# Patient Record
Sex: Female | Born: 1995 | Race: Black or African American | Hispanic: No | Marital: Single | Smoking: Former smoker
Health system: Southern US, Community
[De-identification: ages and names within clinical notes are randomized; demographics above are authoritative.]

## PROBLEM LIST (undated history)

## (undated) ENCOUNTER — Inpatient Hospital Stay: Payer: Self-pay

## (undated) DIAGNOSIS — M1990 Unspecified osteoarthritis, unspecified site: Secondary | ICD-10-CM

## (undated) DIAGNOSIS — J45909 Unspecified asthma, uncomplicated: Secondary | ICD-10-CM

## (undated) DIAGNOSIS — Z8619 Personal history of other infectious and parasitic diseases: Secondary | ICD-10-CM

## (undated) HISTORY — DX: Unspecified asthma, uncomplicated: J45.909

## (undated) HISTORY — DX: Personal history of other infectious and parasitic diseases: Z86.19

---
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
## 2015-02-11 LAB — OB RESULTS CONSOLE VARICELLA ZOSTER ANTIBODY, IGG: VARICELLA IGG: IMMUNE

## 2015-02-11 LAB — OB RESULTS CONSOLE RUBELLA ANTIBODY, IGM: Rubella: IMMUNE

## 2015-02-11 LAB — OB RESULTS CONSOLE GC/CHLAMYDIA
CHLAMYDIA, DNA PROBE: NEGATIVE
GC PROBE AMP, GENITAL: NEGATIVE

## 2015-02-11 LAB — OB RESULTS CONSOLE HEPATITIS B SURFACE ANTIGEN: HEP B S AG: NEGATIVE

## 2015-02-11 LAB — OB RESULTS CONSOLE HIV ANTIBODY (ROUTINE TESTING): HIV: NONREACTIVE

## 2015-02-11 LAB — OB RESULTS CONSOLE RPR: RPR: NONREACTIVE

## 2015-04-01 ENCOUNTER — Encounter: Payer: Self-pay | Admitting: Emergency Medicine

## 2015-04-01 ENCOUNTER — Emergency Department
Admission: EM | Admit: 2015-04-01 | Discharge: 2015-04-01 | Discharge status: Against Medical Advice | Payer: Medicaid Other | Attending: Emergency Medicine | Admitting: Emergency Medicine

## 2015-04-01 DIAGNOSIS — R1010 Upper abdominal pain, unspecified: Secondary | ICD-10-CM | POA: Insufficient documentation

## 2015-04-01 LAB — CBC WITH DIFFERENTIAL/PLATELET
BASOS PCT: 0 %
Basophils Absolute: 0 10*3/uL (ref 0–0.1)
Eosinophils Absolute: 0.1 10*3/uL (ref 0–0.7)
Eosinophils Relative: 0 %
HEMATOCRIT: 32.3 % — AB (ref 35.0–47.0)
HEMOGLOBIN: 10.8 g/dL — AB (ref 12.0–16.0)
LYMPHS PCT: 14 %
Lymphs Abs: 1.6 10*3/uL (ref 1.0–3.6)
MCH: 30.1 pg (ref 26.0–34.0)
MCHC: 33.3 g/dL (ref 32.0–36.0)
MCV: 90.3 fL (ref 80.0–100.0)
MONO ABS: 0.5 10*3/uL (ref 0.2–0.9)
MONOS PCT: 4 %
Neutro Abs: 9.2 10*3/uL — ABNORMAL HIGH (ref 1.4–6.5)
Neutrophils Relative %: 82 %
Platelets: 364 10*3/uL (ref 150–440)
RBC: 3.58 MIL/uL — ABNORMAL LOW (ref 3.80–5.20)
RDW: 14.2 % (ref 11.5–14.5)
WBC: 11.4 10*3/uL — ABNORMAL HIGH (ref 3.6–11.0)

## 2015-04-01 LAB — COMPREHENSIVE METABOLIC PANEL
ALK PHOS: 64 U/L (ref 38–126)
ALT: 31 U/L (ref 14–54)
AST: 22 U/L (ref 15–41)
Albumin: 3.5 g/dL (ref 3.5–5.0)
Anion gap: 7 (ref 5–15)
BUN: 9 mg/dL (ref 6–20)
CALCIUM: 9.2 mg/dL (ref 8.9–10.3)
CO2: 23 mmol/L (ref 22–32)
Chloride: 106 mmol/L (ref 101–111)
Creatinine, Ser: 0.63 mg/dL (ref 0.44–1.00)
GFR calc non Af Amer: >60 mL/min (ref >60)
GLUCOSE: 94 mg/dL (ref 65–99)
Potassium: 3.6 mmol/L (ref 3.5–5.1)
Sodium: 136 mmol/L (ref 135–145)
TOTAL PROTEIN: 7 g/dL (ref 6.5–8.1)

## 2015-04-01 LAB — LIPASE, BLOOD: Lipase: 20 U/L — ABNORMAL LOW (ref 22–51)

## 2015-04-01 NOTE — ED Notes (Signed)
Pt not answering in lobby

## 2015-04-01 NOTE — ED Notes (Signed)
Pt not found in lobby 

## 2015-04-01 NOTE — ED Notes (Signed)
Pt states that she was awakened from sleep with bilat upper abd pain radiating through to her back on each side, pt states that she has vomited a lot since having the pain and states that the pain comes and goes. Pt states that she cont to have pain but not as bad as it was, pt reports being approx 4 mos pregnant

## 2015-04-04 ENCOUNTER — Telehealth: Payer: Self-pay | Admitting: Emergency Medicine

## 2015-04-04 NOTE — ED Notes (Signed)
Called patient due to lwot to inquire about condition and follow up plans. Numbers for home and mobile do not take calls or are not in service.

## 2015-06-21 ENCOUNTER — Encounter: Payer: Self-pay | Admitting: *Deleted

## 2015-06-21 ENCOUNTER — Observation Stay
Admission: EM | Admit: 2015-06-21 | Discharge: 2015-06-21 | Disposition: A | Discharge status: Home / Self Care | Payer: Medicaid Other | Attending: Certified Nurse Midwife | Admitting: Certified Nurse Midwife

## 2015-06-21 DIAGNOSIS — Z3A27 27 weeks gestation of pregnancy: Secondary | ICD-10-CM | POA: Diagnosis not present

## 2015-06-21 DIAGNOSIS — O368120 Decreased fetal movements, second trimester, not applicable or unspecified: Principal | ICD-10-CM | POA: Insufficient documentation

## 2015-06-21 DIAGNOSIS — O368190 Decreased fetal movements, unspecified trimester, not applicable or unspecified: Secondary | ICD-10-CM | POA: Diagnosis present

## 2015-06-21 DIAGNOSIS — O0990 Supervision of high risk pregnancy, unspecified, unspecified trimester: Secondary | ICD-10-CM

## 2015-06-21 NOTE — Progress Notes (Signed)
L&D triage Note  19 year old G1 P0 with EDC=09/16/2015 by a 9 week ultrasound presents at 27 weeks 4 days with decreased FM today.  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 Has been eating well today. No vaginal bleeding or LOF [facility] also remarkable for asthma and BMI >40 Prenatal genetic testing has been negative  Exam: General; alert, awake, in NAD BP 116/51 mmHg  Pulse 106  Temp(Src) 98.4 F (36.9 C) (Oral)  Resp 18  LMP  (Approximate)  FHR 140s with accelerations to 160s, moderate variability Toco: no contractions  A: IUP at 27wks4d with reactive NST  P: DC home  FU next week for ROB and 1 HOUR gtt.  Farrel ConnersGUTIERREZ, Vivianne Carles, CNM

## 2015-07-28 NOTE — L&D Delivery Note (Signed)
VAGINAL DELIVERY NOTE:  Date of Delivery: 09/15/2015 Primary OB: [facility]  Gestational Age/EDD: [redacted]w[redacted]d 09/16/2015, by Other Basis Antepartum complications: Obesity, Asthma Attending Physician: Annamarie Major, MD, FACOG Delivery Type: spontaneous vaginal delivery  Anesthesia: epidural Laceration: 1st degree Episiotomy: none Placenta: spontaneous Intrapartum complications: None Estimated Blood Loss: GBS: None Procedure Details: NSVD, placenta easily delivers, repair of small first degree lac.  Baby: Liveborn female, Apgars 8/9

## 2015-08-22 ENCOUNTER — Observation Stay
Admission: EM | Admit: 2015-08-22 | Discharge: 2015-08-22 | Disposition: A | Discharge status: Home / Self Care | Payer: Medicaid Other | Attending: Obstetrics & Gynecology | Admitting: Obstetrics & Gynecology

## 2015-08-22 DIAGNOSIS — Z3A39 39 weeks gestation of pregnancy: Secondary | ICD-10-CM | POA: Diagnosis not present

## 2015-08-22 DIAGNOSIS — O479 False labor, unspecified: Secondary | ICD-10-CM | POA: Diagnosis present

## 2015-08-22 LAB — PROTEIN / CREATININE RATIO, URINE
Creatinine, Urine: 51 mg/dL
PROTEIN CREATININE RATIO: 0.18 mg/mg{creat} — AB (ref 0.00–0.15)
Total Protein, Urine: 9 mg/dL

## 2015-08-22 LAB — COMPREHENSIVE METABOLIC PANEL
ALBUMIN: 3 g/dL — AB (ref 3.5–5.0)
ALK PHOS: 205 U/L — AB (ref 38–126)
ALT: 10 U/L — AB (ref 14–54)
AST: 14 U/L — AB (ref 15–41)
Anion gap: 8 (ref 5–15)
BILIRUBIN TOTAL: 0.4 mg/dL (ref 0.3–1.2)
BUN: 7 mg/dL (ref 6–20)
CALCIUM: 9 mg/dL (ref 8.9–10.3)
CO2: 21 mmol/L — ABNORMAL LOW (ref 22–32)
Chloride: 107 mmol/L (ref 101–111)
Creatinine, Ser: 0.51 mg/dL (ref 0.44–1.00)
GFR calc Af Amer: >60 mL/min (ref >60)
GFR calc non Af Amer: >60 mL/min (ref >60)
GLUCOSE: 81 mg/dL (ref 65–99)
Potassium: 3.9 mmol/L (ref 3.5–5.1)
Sodium: 136 mmol/L (ref 135–145)
TOTAL PROTEIN: 6.8 g/dL (ref 6.5–8.1)

## 2015-08-22 LAB — CBC
HEMATOCRIT: 31.3 % — AB (ref 35.0–47.0)
HEMOGLOBIN: 10.1 g/dL — AB (ref 12.0–16.0)
MCH: 26.7 pg (ref 26.0–34.0)
MCHC: 32.2 g/dL (ref 32.0–36.0)
MCV: 82.7 fL (ref 80.0–100.0)
Platelets: 397 10*3/uL (ref 150–440)
RBC: 3.79 MIL/uL — ABNORMAL LOW (ref 3.80–5.20)
RDW: 16.7 % — ABNORMAL HIGH (ref 11.5–14.5)
WBC: 8.3 10*3/uL (ref 3.6–11.0)

## 2015-08-22 LAB — URINALYSIS COMPLETE WITH MICROSCOPIC (ARMC ONLY)
BILIRUBIN URINE: NEGATIVE
Bacteria, UA: NONE SEEN
Glucose, UA: NEGATIVE mg/dL
Hgb urine dipstick: NEGATIVE
KETONES UR: NEGATIVE mg/dL
Leukocytes, UA: NEGATIVE
Nitrite: NEGATIVE
PROTEIN: NEGATIVE mg/dL
Specific Gravity, Urine: 1.005 (ref 1.005–1.030)
pH: 6 (ref 5.0–8.0)

## 2015-08-22 LAB — OB RESULTS CONSOLE GBS: GBS: NEGATIVE

## 2015-08-22 MED ORDER — ACETAMINOPHEN 325 MG PO TABS: 650.0000 mg | ORAL_TABLET | ORAL | Status: DC | PRN

## 2015-08-22 MED ORDER — ONDANSETRON HCL 4 MG/2ML IJ SOLN: 4.0000 mg | Freq: Four times a day (QID) | INTRAMUSCULAR | Status: DC | PRN

## 2015-08-22 NOTE — Discharge Instructions (Signed)
Discharge instructions reviewed with patient, pt. Verbalized understanding of all discharge instructions.  Pt. To follow-up at the office on Monday, January 30th.

## 2015-08-22 NOTE — Plan of Care (Signed)
G1 P0 EDC 09/16/15 36.3 week pt arrived to [facility] from [facility] per Dr. Vergie Living for b/p evaluation. Will notify MD of pt arrival and request orders. Pt placed on EFM and seriel b/p begun at this time. Ellison Carwin RNC

## 2015-08-26 LAB — OB RESULTS CONSOLE RPR: RPR: NONREACTIVE

## 2015-08-26 LAB — OB RESULTS CONSOLE HIV ANTIBODY (ROUTINE TESTING): HIV: NONREACTIVE

## 2015-09-14 ENCOUNTER — Inpatient Hospital Stay
Admission: EM | Admit: 2015-09-14 | Discharge: 2015-09-17 | DRG: 775 | Disposition: A | Discharge status: Home / Self Care | Payer: Medicaid Other | Attending: Obstetrics & Gynecology | Admitting: Obstetrics & Gynecology

## 2015-09-14 DIAGNOSIS — J45909 Unspecified asthma, uncomplicated: Secondary | ICD-10-CM | POA: Diagnosis present

## 2015-09-14 DIAGNOSIS — O9952 Diseases of the respiratory system complicating childbirth: Secondary | ICD-10-CM | POA: Diagnosis present

## 2015-09-14 DIAGNOSIS — O479 False labor, unspecified: Secondary | ICD-10-CM | POA: Diagnosis present

## 2015-09-14 DIAGNOSIS — O4290 Premature rupture of membranes, unspecified as to length of time between rupture and onset of labor, unspecified weeks of gestation: Secondary | ICD-10-CM | POA: Diagnosis present

## 2015-09-14 DIAGNOSIS — Z3A39 39 weeks gestation of pregnancy: Secondary | ICD-10-CM

## 2015-09-14 DIAGNOSIS — O0990 Supervision of high risk pregnancy, unspecified, unspecified trimester: Secondary | ICD-10-CM

## 2015-09-14 DIAGNOSIS — O99214 Obesity complicating childbirth: Principal | ICD-10-CM | POA: Diagnosis present

## 2015-09-14 NOTE — OB Triage Note (Signed)
Pt presents to L&D with c/o gush of fluid at 2240. Continues to leak upon arrival. Denies contractions or bleeding, reports good fetal movements. EFM applied and explained plan to monitor fetal and maternal well being and assess complaints. Upon initial assessment BP elevated, pt reports  Being evaluated in L&D 2 weeks ago for blood pressure but "everything was ok".

## 2015-09-15 ENCOUNTER — Inpatient Hospital Stay: Payer: Medicaid Other | Admitting: Anesthesiology

## 2015-09-15 DIAGNOSIS — O99214 Obesity complicating childbirth: Secondary | ICD-10-CM | POA: Diagnosis present

## 2015-09-15 DIAGNOSIS — O4290 Premature rupture of membranes, unspecified as to length of time between rupture and onset of labor, unspecified weeks of gestation: Secondary | ICD-10-CM | POA: Diagnosis present

## 2015-09-15 DIAGNOSIS — J45909 Unspecified asthma, uncomplicated: Secondary | ICD-10-CM | POA: Diagnosis present

## 2015-09-15 DIAGNOSIS — O9952 Diseases of the respiratory system complicating childbirth: Secondary | ICD-10-CM | POA: Diagnosis present

## 2015-09-15 DIAGNOSIS — Z3A39 39 weeks gestation of pregnancy: Secondary | ICD-10-CM | POA: Diagnosis not present

## 2015-09-15 LAB — CBC
HEMATOCRIT: 32.3 % — AB (ref 35.0–47.0)
HEMOGLOBIN: 10.5 g/dL — AB (ref 12.0–16.0)
MCH: 27.6 pg (ref 26.0–34.0)
MCHC: 32.4 g/dL (ref 32.0–36.0)
MCV: 85.1 fL (ref 80.0–100.0)
Platelets: 357 10*3/uL (ref 150–440)
RBC: 3.79 MIL/uL — ABNORMAL LOW (ref 3.80–5.20)
RDW: 18.9 % — ABNORMAL HIGH (ref 11.5–14.5)
WBC: 8.2 10*3/uL (ref 3.6–11.0)

## 2015-09-15 LAB — COMPREHENSIVE METABOLIC PANEL
ALK PHOS: 229 U/L — AB (ref 38–126)
ALT: 11 U/L — ABNORMAL LOW (ref 14–54)
ANION GAP: 7 (ref 5–15)
AST: 20 U/L (ref 15–41)
Albumin: 2.7 g/dL — ABNORMAL LOW (ref 3.5–5.0)
BILIRUBIN TOTAL: 0.4 mg/dL (ref 0.3–1.2)
BUN: 9 mg/dL (ref 6–20)
CALCIUM: 9.2 mg/dL (ref 8.9–10.3)
CO2: 22 mmol/L (ref 22–32)
CREATININE: 0.63 mg/dL (ref 0.44–1.00)
Chloride: 109 mmol/L (ref 101–111)
GFR calc non Af Amer: >60 mL/min (ref >60)
Glucose, Bld: 104 mg/dL — ABNORMAL HIGH (ref 65–99)
Potassium: 4.2 mmol/L (ref 3.5–5.1)
Sodium: 138 mmol/L (ref 135–145)
TOTAL PROTEIN: 6.4 g/dL — AB (ref 6.5–8.1)

## 2015-09-15 LAB — ABO/RH: ABO/RH(D): B POS

## 2015-09-15 LAB — OB RESULTS CONSOLE ABO/RH: RH Type: POSITIVE

## 2015-09-15 LAB — TYPE AND SCREEN
ABO/RH(D): B POS
ANTIBODY SCREEN: NEGATIVE

## 2015-09-15 LAB — CHLAMYDIA/NGC RT PCR (ARMC ONLY)
Chlamydia Tr: NOT DETECTED
N gonorrhoeae: NOT DETECTED

## 2015-09-15 LAB — PROTEIN / CREATININE RATIO, URINE
Creatinine, Urine: 107 mg/dL
Protein Creatinine Ratio: 0.79 mg/mg{Cre} — ABNORMAL HIGH (ref 0.00–0.15)
Total Protein, Urine: 84 mg/dL

## 2015-09-15 MED ORDER — OXYTOCIN BOLUS FROM INFUSION
500.0000 mL | INTRAVENOUS | Status: DC
  Administered 2015-09-15: 500 mL via INTRAVENOUS

## 2015-09-15 MED ORDER — SIMETHICONE 80 MG PO CHEW: 80.0000 mg | CHEWABLE_TABLET | ORAL | Status: DC | PRN

## 2015-09-15 MED ORDER — LACTATED RINGERS IV SOLN: 500.0000 mL | INTRAVENOUS | Status: DC | PRN

## 2015-09-15 MED ORDER — SENNOSIDES-DOCUSATE SODIUM 8.6-50 MG PO TABS
2.0000 | ORAL_TABLET | ORAL | Status: DC
  Administered 2015-09-15 – 2015-09-16 (×2): 2 via ORAL
  Filled 2015-09-15 (×2): qty 2

## 2015-09-15 MED ORDER — FENTANYL 2.5 MCG/ML W/ROPIVACAINE 0.2% IN NS 100 ML EPIDURAL INFUSION (ARMC-ANES)
EPIDURAL | Status: AC
  Administered 2015-09-15: 9 mL/h via EPIDURAL
  Filled 2015-09-15: qty 100

## 2015-09-15 MED ORDER — LIDOCAINE HCL (PF) 1 % IJ SOLN
INTRAMUSCULAR | Status: AC
  Filled 2015-09-15: qty 30

## 2015-09-15 MED ORDER — OXYCODONE-ACETAMINOPHEN 5-325 MG PO TABS: 1.0000 | ORAL_TABLET | ORAL | Status: DC | PRN

## 2015-09-15 MED ORDER — ZOLPIDEM TARTRATE 5 MG PO TABS: 5.0000 mg | ORAL_TABLET | Freq: Every evening | ORAL | Status: DC | PRN

## 2015-09-15 MED ORDER — MISOPROSTOL 200 MCG PO TABS
ORAL_TABLET | ORAL | Status: AC
  Filled 2015-09-15: qty 4

## 2015-09-15 MED ORDER — IBUPROFEN 600 MG PO TABS
600.0000 mg | ORAL_TABLET | Freq: Four times a day (QID) | ORAL | Status: DC
  Administered 2015-09-15 – 2015-09-17 (×7): 600 mg via ORAL
  Filled 2015-09-15 (×7): qty 1

## 2015-09-15 MED ORDER — AMMONIA AROMATIC IN INHA
RESPIRATORY_TRACT | Status: AC
  Filled 2015-09-15: qty 10

## 2015-09-15 MED ORDER — PHENYLEPHRINE 40 MCG/ML (10ML) SYRINGE FOR IV PUSH (FOR BLOOD PRESSURE SUPPORT)
80.0000 ug | PREFILLED_SYRINGE | INTRAVENOUS | Status: DC | PRN
  Filled 2015-09-15: qty 2

## 2015-09-15 MED ORDER — ONDANSETRON HCL 4 MG/2ML IJ SOLN: 4.0000 mg | Freq: Four times a day (QID) | INTRAMUSCULAR | Status: DC | PRN

## 2015-09-15 MED ORDER — SODIUM CHLORIDE 0.9% FLUSH: 3.0000 mL | Freq: Two times a day (BID) | INTRAVENOUS | Status: DC

## 2015-09-15 MED ORDER — LACTATED RINGERS IV SOLN
INTRAVENOUS | Status: DC
Start: 2015-09-15 — End: 2015-09-15
  Administered 2015-09-15: via INTRAVENOUS

## 2015-09-15 MED ORDER — BUPIVACAINE HCL (PF) 0.25 % IJ SOLN
INTRAMUSCULAR | Status: DC | PRN
  Administered 2015-09-15: 5 mL via EPIDURAL

## 2015-09-15 MED ORDER — LACTATED RINGERS IV SOLN: 500.0000 mL | Freq: Once | INTRAVENOUS | Status: DC

## 2015-09-15 MED ORDER — OXYTOCIN 40 UNITS IN LACTATED RINGERS INFUSION - SIMPLE MED
2.5000 [IU]/h | INTRAVENOUS | Status: DC
  Administered 2015-09-15: 2.5 [IU]/h via INTRAVENOUS
  Filled 2015-09-15: qty 1000

## 2015-09-15 MED ORDER — LIDOCAINE HCL (PF) 1 % IJ SOLN
INTRAMUSCULAR | Status: DC | PRN
  Administered 2015-09-15: 3 mL via SUBCUTANEOUS

## 2015-09-15 MED ORDER — ONDANSETRON HCL 4 MG/2ML IJ SOLN: 4.0000 mg | INTRAMUSCULAR | Status: DC | PRN

## 2015-09-15 MED ORDER — OXYCODONE-ACETAMINOPHEN 5-325 MG PO TABS: 2.0000 | ORAL_TABLET | ORAL | Status: DC | PRN

## 2015-09-15 MED ORDER — EPHEDRINE 5 MG/ML INJ
10.0000 mg | INTRAVENOUS | Status: DC | PRN
  Filled 2015-09-15: qty 2

## 2015-09-15 MED ORDER — FENTANYL 2.5 MCG/ML W/ROPIVACAINE 0.2% IN NS 100 ML EPIDURAL INFUSION (ARMC-ANES): 9.0000 mL/h | EPIDURAL | Status: DC

## 2015-09-15 MED ORDER — SODIUM CHLORIDE 0.9% FLUSH: 3.0000 mL | INTRAVENOUS | Status: DC | PRN

## 2015-09-15 MED ORDER — BUTORPHANOL TARTRATE 1 MG/ML IJ SOLN
1.0000 mg | INTRAMUSCULAR | Status: DC | PRN
  Administered 2015-09-15 (×3): 1 mg via INTRAVENOUS
  Filled 2015-09-15 (×3): qty 1

## 2015-09-15 MED ORDER — BENZOCAINE-MENTHOL 20-0.5 % EX AERO: 1.0000 "application " | INHALATION_SPRAY | CUTANEOUS | Status: DC | PRN

## 2015-09-15 MED ORDER — DIPHENHYDRAMINE HCL 25 MG PO CAPS: 25.0000 mg | ORAL_CAPSULE | Freq: Four times a day (QID) | ORAL | Status: DC | PRN

## 2015-09-15 MED ORDER — ACETAMINOPHEN 325 MG PO TABS: 650.0000 mg | ORAL_TABLET | ORAL | Status: DC | PRN

## 2015-09-15 MED ORDER — SODIUM CHLORIDE 0.9 % IV SOLN: 250.0000 mL | INTRAVENOUS | Status: DC | PRN

## 2015-09-15 MED ORDER — LANOLIN HYDROUS EX OINT: TOPICAL_OINTMENT | CUTANEOUS | Status: DC | PRN

## 2015-09-15 MED ORDER — OXYTOCIN 10 UNIT/ML IJ SOLN
INTRAMUSCULAR | Status: AC
  Filled 2015-09-15: qty 2

## 2015-09-15 MED ORDER — WITCH HAZEL-GLYCERIN EX PADS: 1.0000 "application " | MEDICATED_PAD | CUTANEOUS | Status: DC | PRN

## 2015-09-15 MED ORDER — TERBUTALINE SULFATE 1 MG/ML IJ SOLN: 0.2500 mg | Freq: Once | INTRAMUSCULAR | Status: DC | PRN

## 2015-09-15 MED ORDER — LIDOCAINE-EPINEPHRINE (PF) 1.5 %-1:200000 IJ SOLN
INTRAMUSCULAR | Status: DC | PRN
  Administered 2015-09-15: 4 mL via EPIDURAL

## 2015-09-15 MED ORDER — DIPHENHYDRAMINE HCL 50 MG/ML IJ SOLN: 12.5000 mg | INTRAMUSCULAR | Status: DC | PRN

## 2015-09-15 MED ORDER — OXYTOCIN 40 UNITS IN LACTATED RINGERS INFUSION - SIMPLE MED
1.0000 m[IU]/min | INTRAVENOUS | Status: DC
  Administered 2015-09-15: 1 m[IU]/min via INTRAVENOUS

## 2015-09-15 MED ORDER — DIBUCAINE 1 % RE OINT: 1.0000 "application " | TOPICAL_OINTMENT | RECTAL | Status: DC | PRN

## 2015-09-15 MED ORDER — ONDANSETRON HCL 4 MG PO TABS: 4.0000 mg | ORAL_TABLET | ORAL | Status: DC | PRN

## 2015-09-15 NOTE — Anesthesia Procedure Notes (Addendum)
Epidural Patient location during procedure: OB Start time: 09/15/2015 4:51 AM  Staffing Performed by: anesthesiologist   Preanesthetic Checklist Completed: patient identified, site marked, surgical consent, pre-op evaluation, timeout performed, IV checked, risks and benefits discussed and monitors and equipment checked  Epidural Patient position: sitting Prep: Betadine Patient monitoring: heart rate, continuous pulse ox and blood pressure Approach: midline Location: L4-L5 Injection technique: LOR saline  Needle:  Needle type: Tuohy  Needle gauge: 18 G Needle length: 9 cm and 9 Needle insertion depth: 9 cm Catheter type: closed end flexible Catheter size: 20 Guage Catheter at skin depth: 15 cm Test dose: negative and 1.5% lidocaine with Epi 1:200 K  Assessment Sensory level: T10 Events: blood not aspirated, injection not painful, no injection resistance, negative IV test and no paresthesia  Additional Notes   Patient tolerated the insertion well without complications.-SATD -IVTD. No paresthesia. Refer to [facility] nursing for VS and dosing. Easy pass of catheter and well tolerated.  Reason for block:procedure for pain

## 2015-09-15 NOTE — H&P (Signed)
Obstetrics Admission History & Physical   SROM 2240.   HPI:  20 y.o. G1P0 @ [redacted]w[redacted]d (09/16/2015, by [place]). Admitted on 09/14/2015:   Patient Active Problem List   Diagnosis Date Noted  . spontaneous rupture of membranes 09/15/2015  . Irregular contractions 08/22/2015  . Pregnancy 06/21/2015  . Obesity affecting pregnancy in second trimester 06/21/2015  . Labor     Presents for SROM at 2240, mild ctxs, no VB.Marland Kitchen  Prenatal care at: at [facility]     Pregnancy complicated by obesity, asthma  PMHx: History reviewed. No pertinent past medical history. PSHx: History reviewed. No pertinent past surgical history. Medications:  Prescriptions prior to admission  Medication Sig Dispense Refill Last Dose  . Prenatal Vit-Fe Fumarate-FA (MULTIVITAMIN-PRENATAL) 27-0.8 MG TABS tablet Take 1 tablet by mouth daily at 12 noon.   Unknown at Unknown time   Allergies: has No Known Allergies. OBHx:  OB History  Gravida Para Term Preterm AB SAB TAB Ectopic Multiple Living  1             # Outcome Date GA Lbr Len/2nd Weight Sex Delivery Anes PTL Lv  1 Current              ONG:EXBMWUXL/KGMWNUUVOZDG except as detailed in HPI. Soc Hx: Pregnancy welcomed  Objective:   Filed Vitals:   09/14/15 2322 09/14/15 2328  BP: 160/103 157/96  Pulse: 90 99  Temp: 98.9 F (37.2 C)   Resp: 20    General: Well nourished, well developed female in no acute distress.  Skin: Warm and dry.  Cardiovascular:Regular rate and rhythm. Respiratory: Clear to auscultation bilateral. Normal respiratory effort Abdomen: obese, gravid, min T Neuro/Psych: Normal mood and affect.   Pelvic exam: is limited by body habitus EGBUS: within normal limits Vagina: within normal limits  Cervix: 1 cm Uterus: Spontaneous uterine activity  Adnexa: not evaluated  EFM:FHR: 100-120 bpm, variability: moderate,  accelerations:  Present,  decelerations:  Absent Toco: intermittant   Perinatal info:  Blood type: B positive Rubella-  Immune Varicella -Immune TDaP Given during third trimester of this pregnancy RPR NR / HIV Neg/ HBsAg Neg   Assessment & Plan:   20 y.o. G1P0 @ [redacted]w[redacted]d, Admitted on 09/14/2015: Labor with SROM at home     Admit for labor and Fetal Wellbeing Reassuring  Monitor for spontaneous ctxs, add Pitocin if necessary Analgesia as needed Labs to monitor for PIH/ preclampsia

## 2015-09-15 NOTE — Anesthesia Preprocedure Evaluation (Signed)
Anesthesia Evaluation  Patient identified by MRN, date of birth, ID band Patient awake    Reviewed: Allergy & Precautions, H&P , NPO status , Patient's Chart, lab work & pertinent test results, reviewed documented beta blocker date and time   Airway Mallampati: III  TM Distance: >3 FB Neck ROM: full    Dental no notable dental hx. (+) Teeth Intact   Pulmonary neg pulmonary ROS,    Pulmonary exam normal breath sounds clear to auscultation       Cardiovascular Exercise Tolerance: Good negative cardio ROS Normal cardiovascular exam Rhythm:regular Rate:Normal     Neuro/Psych negative neurological ROS  negative psych ROS   GI/Hepatic negative GI ROS, Neg liver ROS,   Endo/Other  negative endocrine ROSMorbid obesity  Renal/GU negative Renal ROS  negative genitourinary   Musculoskeletal   Abdominal   Peds  Hematology negative hematology ROS (+)   Anesthesia Other Findings   Reproductive/Obstetrics (+) Pregnancy                             Anesthesia Physical Anesthesia Plan  ASA: III  Anesthesia Plan: Regional and Epidural   Post-op Pain Management:    Induction:   Airway Management Planned:   Additional Equipment:   Intra-op Plan:   Post-operative Plan:   Informed Consent: I have reviewed the patients History and Physical, chart, labs and discussed the procedure including the risks, benefits and alternatives for the proposed anesthesia with the patient or authorized representative who has indicated his/her understanding and acceptance.     Plan Discussed with: CRNA  Anesthesia Plan Comments:         Anesthesia Quick Evaluation

## 2015-09-15 NOTE — Discharge Instructions (Signed)

## 2015-09-15 NOTE — Plan of Care (Signed)
Requesting pain medication

## 2015-09-15 NOTE — Anesthesia Postprocedure Evaluation (Signed)
Anesthesia Post Note  Patient: Kerry Hester  Procedure(s) Performed: * No procedures listed *  Patient location during evaluation: Mother Baby Anesthesia Type: Epidural Level of consciousness: awake and awake and alert Pain management: pain level controlled Vital Signs Assessment: post-procedure vital signs reviewed and stable Respiratory status: nonlabored ventilation and spontaneous breathing Cardiovascular status: blood pressure returned to baseline Postop Assessment: no headache, no backache, epidural receding, no signs of nausea or vomiting, adequate PO intake and patient able to bend at knees Anesthetic complications: no    Last Vitals:  Filed Vitals:   09/15/15 1318 09/15/15 1342  BP: 100/49 147/71  Pulse: 90 89  Temp:  36.8 C  Resp: 16 18    Last Pain:  Filed Vitals:   09/15/15 1400  PainSc: 4                  Basilio Cairo

## 2015-09-15 NOTE — Discharge Summary (Signed)
Obstetrical Discharge Summary  Date of Admission: 09/14/2015 Date of Discharge: 09/17/15 Discharge Diagnosis: Term Pregnancy-delivered Primary OB:  [facility]   Gestational Age at Delivery: [redacted]w[redacted]d  Antepartum complications: Obesity, Asthma Date of Delivery: 09/15/15  Delivered By: Tiburcio Pea Delivery Type: spontaneous vaginal delivery Intrapartum complications/course: None Anesthesia: epidural Placenta: spontaneous Laceration: 1st degree Episiotomy: none Live born Female Birth Weight:  3800 APGAR: 8, 9   Post partum course: Since the delivery, patient has tolerate activity, diet, and daily functions without difficulty or complication.  Min lochia.  No breast concerns at this time.  No signs of depression currently.   Postpartum Exam:General appearance: alert, cooperative and appears stated age  Disposition: home with infant Rh Immune globulin given: not applicable Rubella vaccine given: no Varicella vaccine given: no Tdap vaccine given in AP or PP setting: given during prenatal care Flu vaccine given in AP or PP setting: given during prenatal care Contraception: Depo-Provera, Nexplanon, to be determined at post partum visit  Prenatal Labs: B POS//Rubella Immune//RPR negative//HIV negative/HepB Surface Ag negative//plans to bottle feed  Plan:  Kerry Hester was discharged to home in good condition. Follow-up appointment with [facility] provider in 6 weeks  Discharge Medications:   Medication List    TAKE these medications        multivitamin-prenatal 27-0.8 MG Tabs tablet  Take 1 tablet by mouth daily at 12 noon.        Follow-up arrangements:  With Dr Tiburcio Pea at 6 weeks postpartum for follow up   [facility], CNM   This patient and plan were discussed with Dr Vergie Living 09/17/2015

## 2015-09-16 LAB — CBC
HEMATOCRIT: 29.6 % — AB (ref 35.0–47.0)
HEMOGLOBIN: 9.5 g/dL — AB (ref 12.0–16.0)
MCH: 27 pg (ref 26.0–34.0)
MCHC: 32.1 g/dL (ref 32.0–36.0)
MCV: 84.1 fL (ref 80.0–100.0)
Platelets: 286 10*3/uL (ref 150–440)
RBC: 3.51 MIL/uL — ABNORMAL LOW (ref 3.80–5.20)
RDW: 19.2 % — ABNORMAL HIGH (ref 11.5–14.5)
WBC: 13.2 10*3/uL — ABNORMAL HIGH (ref 3.6–11.0)

## 2015-09-16 LAB — RPR: RPR: NONREACTIVE

## 2015-09-16 NOTE — Progress Notes (Signed)
Post Partum Day 1 Subjective: no complaints, voiding and bleeding is slowing  Objective: Blood pressure 113/65, pulse 78, temperature 98.5 F (36.9 C), temperature source Oral, resp. rate 18, height 5' (1.524 m), weight 118.389 kg (261 lb), SpO2 99 %, unknown if currently breastfeeding.  Physical Exam:  General: alert, cooperative and no distress. Is pumping and syringe feeding currently, but will be transitioning to bottle feeding Lochia: appropriate Uterine Fundus: firm but at U+2/ML/NT DVT Evaluation: No evidence of DVT seen on physical exam.   Recent Labs  09/15/15 0008 09/16/15 0636  HGB 10.5* 9.5*  HCT 32.3* 29.6*  WBC 8.2 13.2*  PLT 357 286    Assessment/Plan: Plan for discharge tomorrow  Watch fundus/ bleeding closely B POS/RI/ VI/ TDAP UTD      LOS: 1 day   Kerry Hester 09/16/2015, 11:29 AM

## 2015-09-16 NOTE — Lactation Note (Signed)
This note was copied from a baby's chart. Lactation Consultation Note Mom has large breasts & large nipple/areola.  Rylyn unable to achieve deep latch.  She sucks in her top lip and keeps trying to suck on tongue.  Mom has opted to pump and syringe feed pumped colostrum d/t nipples being too sore to latch and being unable to latch her on her own.  Pre pumped and got 5 ml.  Had to increase flange size to #30 and may need to go to #36 on right side d/t such large nipples/areola especially on right.  Convinced mom to try again on right breast with #24 nipple shield which is least sore side.  Filled nipple shield with 2 ml from the 5 ml that mom expressed.  Rylyn latched with minimal difficulty allowing mom to try on her own after filling NS with colostrum.  Mom denies pain when using nipple shield.  Nipple shield helps to keep tongue down and top lip flanged out for better suck.  Faxed information and spoke with [facility] about mom getting pump on discharge if still having difficulty.  There are no pumps available at [facility], but they put mom on waiting list (mom is second on list).  Demonstrated once again how to hand express colostrum.    Patient Name: Kerry Hester [id number] Date: 09/16/2015 Reason for consult: Follow-up assessment   Maternal Data Formula Feeding for Exclusion: No Has patient been taught Hand Expression?: Yes Does the patient have breastfeeding experience prior to this delivery?: No  Feeding Feeding Type: Breast Fed Length of feed: 25 min  LATCH Score/Interventions Latch: Repeated attempts needed to sustain latch, nipple held in mouth throughout feeding, stimulation needed to elicit sucking reflex. Intervention(s): Adjust position;Assist with latch;Breast massage;Breast compression  Audible Swallowing: A few with stimulation Intervention(s): Hand expression;Alternate breast massage  Type of Nipple: Everted at rest and after stimulation (Large breasts & nipple/areola, esp. on  right)  Comfort (Breast/Nipple): Filling, red/small blisters or bruises, mild/mod discomfort Problem noted:  (Mom c/o nipples too sore to latch - tried NS which helped)  Problem noted: Severe discomfort Interventions (Mild/moderate discomfort): Hand massage;Hand expression;Pre-pump if needed;Comfort gels;Breast shields Interventions (Severe discomfort): Double electric pum;Flange size;Observe pumping  Hold (Positioning): Assistance needed to correctly position infant at breast and maintain latch. (Using Nipple shield on right breast) Intervention(s): Support Pillows;Position options  LATCH Score: 6  Lactation Tools Discussed/Used Tools: Nipple Dorris Carnes;Flanges;Pump;Lanolin Nipple shield size: 24 Flange Size: 30 Breast pump type: Double-Electric Breast Pump (Symphony set up in room  to supplement) WIC Program: Yes Pump Review: Setup, frequency, and cleaning;Milk Storage Initiated by:: S.Deliyah Muckle,RN,BSN,IBCLC Date initiated:: 09/16/15   Consult Status Consult Status: Follow-up Follow-up type: Call as needed    Louis Meckel 09/16/2015, 3:24 PM

## 2015-09-17 MED ORDER — MEDROXYPROGESTERONE ACETATE 150 MG/ML IM SUSP
150.0000 mg | Freq: Once | INTRAMUSCULAR | Status: AC
  Administered 2015-09-17: 150 mg via INTRAMUSCULAR
  Filled 2015-09-17: qty 1

## 2015-09-17 NOTE — Progress Notes (Signed)
Discharged to home.  To car with newborn in infant car seat.

## 2015-09-18 NOTE — Final Progress Note (Signed)
Physician Final Progress Note Patient ID: JAQUANDA WICKERSHAM MRN: [id number] DOB/AGE: [birth year] 20 y.o.  Admit date: 08/22/2015 Admitting provider: Nadara Mustard, MD Discharge date: 08/22/15  Admission Diagnoses: Patient presented for evaluation of labor.  Patient had cervical exam by RN and this was reported to me. I reviewed her vital signs and fetal tracing, both of which were reassuring.  Patient was discharge as she was not laboring.  Discharge Diagnoses:  Active Problems:   Irregular contractions  Procedures: A NST procedure was performed with FHR monitoring and a normal baseline established, appropriate time of 20-40 minutes of evaluation, and accels >2 seen w 15x15 characteristics.  Results show a REACTIVE NST.   Discharge Condition: good  Disposition: 01-Home or Self Care  Diet: Regular diet  Discharge Activity: Activity as tolerated     Medication List    TAKE these medications        multivitamin-prenatal 27-0.8 MG Tabs tablet  Take 1 tablet by mouth daily at 12 noon.           Follow-up Information    Follow up Today.   Why:  Pt. to follow up at the office on Monday, January 30th. Please call tomorrow to schedule an appt.       Follow up with [facility].     Signed: Letitia Libra 09/18/2015, 12:26 PM

## 2017-05-23 ENCOUNTER — Encounter: Payer: Self-pay | Admitting: Emergency Medicine

## 2017-05-23 ENCOUNTER — Emergency Department
Admission: EM | Admit: 2017-05-23 | Discharge: 2017-05-23 | Disposition: A | Discharge status: Home / Self Care | Payer: Self-pay | Attending: Emergency Medicine | Admitting: Emergency Medicine

## 2017-05-23 DIAGNOSIS — Z3490 Encounter for supervision of normal pregnancy, unspecified, unspecified trimester: Secondary | ICD-10-CM

## 2017-05-23 DIAGNOSIS — O9989 Other specified diseases and conditions complicating pregnancy, childbirth and the puerperium: Secondary | ICD-10-CM | POA: Insufficient documentation

## 2017-05-23 DIAGNOSIS — Z79899 Other long term (current) drug therapy: Secondary | ICD-10-CM | POA: Insufficient documentation

## 2017-05-23 DIAGNOSIS — Z3A00 Weeks of gestation of pregnancy not specified: Secondary | ICD-10-CM | POA: Insufficient documentation

## 2017-05-23 DIAGNOSIS — N898 Other specified noninflammatory disorders of vagina: Secondary | ICD-10-CM | POA: Insufficient documentation

## 2017-05-23 DIAGNOSIS — O219 Vomiting of pregnancy, unspecified: Secondary | ICD-10-CM

## 2017-05-23 DIAGNOSIS — R1030 Lower abdominal pain, unspecified: Secondary | ICD-10-CM | POA: Insufficient documentation

## 2017-05-23 DIAGNOSIS — Z3201 Encounter for pregnancy test, result positive: Secondary | ICD-10-CM | POA: Insufficient documentation

## 2017-05-23 DIAGNOSIS — O218 Other vomiting complicating pregnancy: Secondary | ICD-10-CM | POA: Insufficient documentation

## 2017-05-23 LAB — COMPREHENSIVE METABOLIC PANEL
ALK PHOS: 67 U/L (ref 38–126)
ALT: 19 U/L (ref 14–54)
ANION GAP: 9 (ref 5–15)
AST: 17 U/L (ref 15–41)
Albumin: 4.1 g/dL (ref 3.5–5.0)
BILIRUBIN TOTAL: 0.4 mg/dL (ref 0.3–1.2)
BUN: 7 mg/dL (ref 6–20)
CALCIUM: 9.3 mg/dL (ref 8.9–10.3)
CO2: 25 mmol/L (ref 22–32)
CREATININE: 0.63 mg/dL (ref 0.44–1.00)
Chloride: 101 mmol/L (ref 101–111)
Glucose, Bld: 87 mg/dL (ref 65–99)
Potassium: 4.2 mmol/L (ref 3.5–5.1)
Sodium: 135 mmol/L (ref 135–145)
Total Protein: 8.2 g/dL — ABNORMAL HIGH (ref 6.5–8.1)

## 2017-05-23 LAB — URINALYSIS, COMPLETE (UACMP) WITH MICROSCOPIC
BILIRUBIN URINE: NEGATIVE
Bacteria, UA: NONE SEEN
GLUCOSE, UA: NEGATIVE mg/dL
HGB URINE DIPSTICK: NEGATIVE
Ketones, ur: 5 mg/dL — AB
NITRITE: NEGATIVE
PH: 7 (ref 5.0–8.0)
Protein, ur: 100 mg/dL — AB
RBC / HPF: NONE SEEN RBC/hpf (ref 0–5)
SPECIFIC GRAVITY, URINE: 1.025 (ref 1.005–1.030)

## 2017-05-23 LAB — CBC
HCT: 36.2 % (ref 35.0–47.0)
Hemoglobin: 12.3 g/dL (ref 12.0–16.0)
MCH: 30.6 pg (ref 26.0–34.0)
MCHC: 34 g/dL (ref 32.0–36.0)
MCV: 89.9 fL (ref 80.0–100.0)
PLATELETS: 417 10*3/uL (ref 150–440)
RBC: 4.02 MIL/uL (ref 3.80–5.20)
RDW: 14.7 % — ABNORMAL HIGH (ref 11.5–14.5)
WBC: 11.4 10*3/uL — ABNORMAL HIGH (ref 3.6–11.0)

## 2017-05-23 LAB — POCT PREGNANCY, URINE: Preg Test, Ur: POSITIVE — AB

## 2017-05-23 LAB — HCG, QUANTITATIVE, PREGNANCY: HCG, BETA CHAIN, QUANT, S: 60972 m[IU]/mL — AB (ref <5)

## 2017-05-23 LAB — LIPASE, BLOOD: Lipase: 23 U/L (ref 11–51)

## 2017-05-23 MED ORDER — DOXYLAMINE-PYRIDOXINE 10-10 MG PO TBEC
DELAYED_RELEASE_TABLET | ORAL | 1 refills | Status: DC
Start: 2017-05-23 — End: 2017-09-14

## 2017-05-23 MED ORDER — ONDANSETRON HCL 4 MG/2ML IJ SOLN
4.0000 mg | INTRAMUSCULAR | Status: AC
  Administered 2017-05-23: 4 mg via INTRAVENOUS
  Filled 2017-05-23: qty 2

## 2017-05-23 NOTE — ED Provider Notes (Signed)
[facility] Emergency Department Provider Note  ____________________________________________   First MD Initiated Contact with Patient 05/23/17 2254     (approximate)  I have reviewed the triage vital signs and the nursing notes.   HISTORY  Chief Complaint Abdominal Pain    HPI Kerry WEDA Hester is a 21 y.o. female with no chronic medical history and a history of one child, uncomplicated delivery about 1-1/2 years ago, who presents for evaluation of approximately 4 days of a constellation of symptoms that includes persistent nausea and vomiting, vague and intermittent lower abdominal cramping, and some constipation.  She states that she is hungry as per usual but whenever she tries to eat or drink anything she vomits.  She describes the symptoms as severe.  The intermittent abdominal cramping is mild and not related to anything in particular.  She feels constipated but also acknowledges she has been eating less than usual so this may account for some of it.  She denies fever/chills, chest pain, shortness of breath, and dysuria.  She also has had no vaginal bleeding and no excessive vaginal discharge.  He is not currently on birth control but reports that her menstrual cycle has always been irregular, and it has been more irregular since her delivery and she was on Depo-Provera for a brief interval after her delivery.  No past medical history on file.  Patient Active Problem List   Diagnosis Date Noted  . Postpartum care following vaginal delivery 09/17/2015  . Prolonged spontaneous rupture of membranes 09/15/2015  . Irregular contractions 08/22/2015  . Pregnancy 06/21/2015  . Obesity affecting pregnancy in second trimester 06/21/2015  . Decreased fetal movement 06/21/2015    No past surgical history on file.  Prior to Admission medications   Medication Sig Start Date End Date Taking? Authorizing Provider         Prenatal Vit-Fe Fumarate-FA  (MULTIVITAMIN-PRENATAL) 27-0.8 MG TABS tablet Take 1 tablet by mouth daily at 12 noon.    [provider]    Allergies Patient has no known allergies.  No family history on file.  Social History Social History  Substance Use Topics  . Smoking status: Never Smoker  . Smokeless tobacco: Not on file  . Alcohol use Not on file    Review of Systems Constitutional: No fever/chills Eyes: No visual changes. ENT: No sore throat. Cardiovascular: Denies chest pain. Respiratory: Denies shortness of breath. Gastrointestinal: Severe nausea and vomiting that seems to be related to any sort of oral intake.  Mild intermittent lower abdominal cramping Genitourinary: Negative for dysuria.  No Vaginal bleeding. Musculoskeletal: Negative for neck pain.  Negative for back pain. Integumentary: Negative for rash. Neurological: Negative for headaches, focal weakness or numbness.   ____________________________________________   PHYSICAL EXAM:  VITAL SIGNS: ED Triage Vitals  Enc Vitals Group     BP 05/23/17 2036 (!) 163/96     Pulse Rate 05/23/17 2036 69     Resp 05/23/17 2036 18     Temp 05/23/17 2036 98.3 F (36.8 C)     Temp Source 05/23/17 2036 Oral     SpO2 05/23/17 2036 100 %     Weight 05/23/17 2037 90.7 kg (200 lb)     Height 05/23/17 2037 1.524 m (5')     Head Circumference --      Peak Flow --      Pain Score 05/23/17 2036 7     Pain Loc --      Pain Edu? --  Excl. in GC? --     Constitutional: Alert and oriented. Well appearing and in no acute distress. Eyes: Conjunctivae are normal. PERRL. EOMI. Head: Atraumatic. Mouth/Throat: Mucous membranes are moist. Neck: No stridor.  No meningeal signs.   Cardiovascular: Normal rate, regular rhythm. Good peripheral circulation. Grossly normal heart sounds. Respiratory: Normal respiratory effort.  No retractions. Lungs CTAB. Gastrointestinal: Soft and nontender. No distention.  Genitourinary: Deferred Musculoskeletal:  No lower extremity tenderness nor edema. No gross deformities of extremities. Neurologic:  Normal speech and language. No gross focal neurologic deficits are appreciated.  Skin:  Skin is warm, dry and intact. No rash noted. Psychiatric: Mood and affect are normal. Speech and behavior are normal.  ____________________________________________   LABS (all labs ordered are listed, but only abnormal results are displayed)  Labs Reviewed  COMPREHENSIVE METABOLIC PANEL - Abnormal; Notable for the following:       Result Value   Total Protein 8.2 (*)    All other components within normal limits  CBC - Abnormal; Notable for the following:    WBC 11.4 (*)    RDW 14.7 (*)    All other components within normal limits  URINALYSIS, COMPLETE (UACMP) WITH MICROSCOPIC - Abnormal; Notable for the following:    Color, Urine AMBER (*)    APPearance CLOUDY (*)    Ketones, ur 5 (*)    Protein, ur 100 (*)    Leukocytes, UA MODERATE (*)    Squamous Epithelial / LPF 6-30 (*)    All other components within normal limits  HCG, QUANTITATIVE, PREGNANCY - Abnormal; Notable for the following:    hCG, Beta Chain, Quant, S 16,109 (*)    All other components within normal limits  POCT PREGNANCY, URINE - Abnormal; Notable for the following:    Preg Test, Ur POSITIVE (*)    All other components within normal limits  URINE CULTURE  LIPASE, BLOOD  POC URINE PREG, ED  ABO/RH   ____________________________________________  EKG  None - EKG not ordered by ED physician ____________________________________________  RADIOLOGY   No results found.  ____________________________________________   PROCEDURES  Critical Care performed: No   Procedure(s) performed:   Procedures   ____________________________________________   INITIAL IMPRESSION / ASSESSMENT AND PLAN / ED COURSE  As part of my medical decision making, I reviewed the following data within the electronic MEDICAL RECORD NUMBER Nursing notes  reviewed and incorporated, Labs reviewed  and Old chart reviewed   Differential diagnosis for the mild lower abdominal pain includes, but is not limited to, ovarian cyst, ovarian torsion, acute appendicitis, diverticulitis, urinary tract infection/pyelonephritis, endometriosis, bowel obstruction, colitis, renal colic, gastroenteritis, hernia, pregnancy related pain including ectopic pregnancy, etc.  N/V likely due to early pregnancy, infectious/viral, food-borne.   However, I believe the patient symptoms are the result of early undiagnosed pregnancy.   I reviewed the patient's lab work and it is notable for a very mild leukocytosis of 11.4.  Her urinalysis has no evidence of UTI; there are 6-30 WBCs but also 6-30 squamous cells, nitrite negative, and minimal ketones.  I will send a urine culture but not treat empirically with antibiotics.  Her beta hCG is approximately 61,000.  She was unaware that she was pregnant but acknowledges that early pregnancy explains her symptoms.  Her physical exam is reassuring with no tenderness to palpation of the lower abdomen.  I offered some IV fluids given the persistent vomiting recently and the ketones in her urine, but she is very hungry and wants  to go get something to eat.  I will give her a dose of Zofran by IV in the emergency department and write her prescription for antiemetics.  She states that the only thing that worked during her last pregnancy was likely just so I have written a prescription for it and she will follow-up with the next available opportunity with [facility].  I gave my usual and customary return precautions.     ____________________________________________  FINAL CLINICAL IMPRESSION(S) / ED DIAGNOSES  Final diagnoses:  Early stage of pregnancy  Nausea and vomiting during pregnancy     MEDICATIONS GIVEN DURING THIS VISIT:  Medications  ondansetron (ZOFRAN) injection 4 mg (4 mg Intravenous Given 05/23/17 2315)     NEW OUTPATIENT  MEDICATIONS STARTED DURING THIS VISIT:  New Prescriptions   DOXYLAMINE-PYRIDOXINE (DICLEGIS) 10-10 MG TBEC    Two tablets at bedtime on day 1 and 2; if symptoms persist, take 1 tablet in morning and 2 tablets at bedtime on day 3; if symptoms persist, may increase to 1 tablet in morning, 1 tablet mid-afternoon, and 2 tablets at bedtime on day 4 for a maximum of 4 tablets per day. Use the minimum dose necessary to control your symptoms.    Modified Medications   No medications on file    Discontinued Medications   No medications on file     Note:  This document was prepared using Dragon voice recognition software and may include unintentional dictation errors.    Loleta RoseForbach, Imanie Darrow, MD 05/23/17 [phone]

## 2017-05-23 NOTE — Discharge Instructions (Signed)
You were evaluated today for nausea and vomiting during pregnancy.  It is safe for you to go home and follow-up as an outpatient since you are feeling better.  Please take your regular medications (unless otherwise canceled in these papers) and any medications prescribed today according to the written instructions.  Return to the emergency department if he develop any new or worsening symptoms that concern you.

## 2017-05-23 NOTE — ED Triage Notes (Signed)
Pt has low abd pain with nausea and vomiting.   Pt reports constipation x 4 days.  Pt states no vag bleeding or discharge.  No urinary sx.  Pt alert.

## 2017-05-25 LAB — URINE CULTURE: SPECIAL REQUESTS: NORMAL

## 2017-05-27 ENCOUNTER — Emergency Department
Admission: EM | Admit: 2017-05-27 | Discharge: 2017-05-27 | Disposition: A | Discharge status: Home / Self Care | Payer: No Typology Code available for payment source | Attending: Emergency Medicine | Admitting: Emergency Medicine

## 2017-05-27 DIAGNOSIS — Z3A08 8 weeks gestation of pregnancy: Secondary | ICD-10-CM | POA: Insufficient documentation

## 2017-05-27 DIAGNOSIS — Y9389 Activity, other specified: Secondary | ICD-10-CM | POA: Diagnosis not present

## 2017-05-27 DIAGNOSIS — O26891 Other specified pregnancy related conditions, first trimester: Secondary | ICD-10-CM | POA: Insufficient documentation

## 2017-05-27 DIAGNOSIS — Y929 Unspecified place or not applicable: Secondary | ICD-10-CM | POA: Diagnosis not present

## 2017-05-27 DIAGNOSIS — R0789 Other chest pain: Secondary | ICD-10-CM | POA: Insufficient documentation

## 2017-05-27 DIAGNOSIS — Y999 Unspecified external cause status: Secondary | ICD-10-CM | POA: Diagnosis not present

## 2017-05-27 HISTORY — DX: Unspecified osteoarthritis, unspecified site: M19.90
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
## 2017-05-27 NOTE — ED Notes (Signed)
Pt ambulatory upon discharge. Pt and significant other verbalized understanding of discharge instructions. Pt A&O x4. Skin warm and dry.
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
## 2017-08-17 ENCOUNTER — Ambulatory Visit (INDEPENDENT_AMBULATORY_CARE_PROVIDER_SITE_OTHER): Payer: Medicaid Other | Admitting: Obstetrics and Gynecology

## 2017-08-17 ENCOUNTER — Encounter: Payer: Self-pay | Admitting: Obstetrics and Gynecology

## 2017-08-17 VITALS — BP 130/82 | HR 101 | Wt 257.5 lb

## 2017-08-17 DIAGNOSIS — R460 Very low level of personal hygiene: Secondary | ICD-10-CM | POA: Diagnosis not present

## 2017-08-17 DIAGNOSIS — O0932 Supervision of pregnancy with insufficient antenatal care, second trimester: Secondary | ICD-10-CM | POA: Diagnosis not present

## 2017-08-17 DIAGNOSIS — Z3492 Encounter for supervision of normal pregnancy, unspecified, second trimester: Secondary | ICD-10-CM

## 2017-08-17 DIAGNOSIS — Z6841 Body Mass Index (BMI) 40.0 and over, adult: Secondary | ICD-10-CM

## 2017-08-17 DIAGNOSIS — O09299 Supervision of pregnancy with other poor reproductive or obstetric history, unspecified trimester: Secondary | ICD-10-CM

## 2017-08-17 NOTE — Progress Notes (Signed)
NOB:   HPI:      [name] is a 22 y.o. G2P1001 who LMP was Patient's last menstrual period was 05/13/2017.  Subjective:   She presents today stating that she is pregnant.  She has no idea of her last menstrual period dates.  She reports no fetal movement.  She states she is taking prenatal vitamins.  She says after the birth of her child she was on birth control but then she stopped.  Her periods were irregular after that.  Patient reports 50 pound weight gain in the last few months. Previous baby weighed more than 9 pounds.  Vaginal birth.     Hx: The following portions of the patient's history were reviewed and updated as appropriate:             She  has a past medical history of Arthritis. She does not have any pertinent problems on file. She  has no past surgical history on file. Her family history is not on file. She  reports that  has never smoked. she has never used smokeless tobacco. She reports that she does not drink alcohol or use drugs. She has No Known Allergies.       Review of Systems:  Review of Systems  Constitutional: Denied constitutional symptoms, night sweats, recent illness, fatigue, fever, insomnia and weight loss.  Eyes: Denied eye symptoms, eye pain, photophobia, vision change and visual disturbance.  Ears/Nose/Throat/Neck: Denied ear, nose, throat or neck symptoms, hearing loss, nasal discharge, sinus congestion and sore throat.  Cardiovascular: Denied cardiovascular symptoms, arrhythmia, chest pain/pressure, edema, exercise intolerance, orthopnea and palpitations.  Respiratory: Denied pulmonary symptoms, asthma, pleuritic pain, productive sputum, cough, dyspnea and wheezing.  Gastrointestinal: Denied, gastro-esophageal reflux, melena, nausea and vomiting.  Genitourinary: Denied genitourinary symptoms including symptomatic vaginal discharge, pelvic relaxation issues, and urinary complaints.  Musculoskeletal: Denied musculoskeletal symptoms,  stiffness, swelling, muscle weakness and myalgia.  Dermatologic: Denied dermatology symptoms, rash and scar.  Neurologic: Denied neurology symptoms, dizziness, headache, neck pain and syncope.  Psychiatric: Denied psychiatric symptoms, anxiety and depression.  Endocrine: Denied endocrine symptoms including hot flashes and night sweats.   Meds:   Current Outpatient Medications on File Prior to Visit  Medication Sig Dispense Refill  . Prenatal Vit-Fe Fumarate-FA (MULTIVITAMIN-PRENATAL) 27-0.8 MG TABS tablet Take 1 tablet by mouth daily at 12 noon.    . Doxylamine-Pyridoxine (DICLEGIS) 10-10 MG TBEC Two tablets at bedtime on day 1 and 2; if symptoms persist, take 1 tablet in morning and 2 tablets at bedtime on day 3; if symptoms persist, may increase to 1 tablet in morning, 1 tablet mid-afternoon, and 2 tablets at bedtime on day 4 for a maximum of 4 tablets per day. Use the minimum dose necessary to control your symptoms. (Patient not taking: Reported on 08/17/2017) 60 tablet 1   No current facility-administered medications on file prior to visit.     Objective:     Vitals:   08/17/17 0821  BP: 130/82  Pulse: (!) 101              Physical examination General NAD, Conversant  HEENT Atraumatic; Op clear with mmm.  Normo-cephalic. Pupils reactive. Anicteric sclerae  Thyroid/Neck Smooth without nodularity or enlargement. Normal ROM.  Neck Supple.  Skin No rashes, lesions or ulceration. Normal palpated skin turgor. No nodularity.  Breasts: No masses or discharge.  Symmetric.  No axillary adenopathy.  Lungs: Clear to auscultation.No rales or wheezes. Normal Respiratory effort, no retractions.  Heart: NSR.  No murmurs or rubs appreciated. No periferal edema  Abdomen: Soft.  Non-tender.  No masses.  No HSM. No hernia  Extremities: Moves all appropriately.  Normal ROM for age. No lymphadenopathy.  Neuro: Oriented to PPT.  Normal mood. Normal affect.     Pelvic:   Vulva: Normal appearance.   No lesions.  Vagina: No lesions or abnormalities noted.  Support: Normal pelvic support.  Urethra No masses tenderness or scarring.  Meatus Normal size without lesions or prolapse.  Cervix: Normal appearance.  No lesions.  Anus: Normal exam.  No lesions.  Perineum: Normal exam.  No lesions.        Bimanual   Adnexae: No masses.  Non-tender to palpation.  Uterus: Enlarged. 18wks  POS FHTs  Non-tender.  Mobile.  AV.  Adnexae: No masses.  Non-tender to palpation.  Cul-de-sac: Negative for abnormality.  Adnexae: No masses.  Non-tender to palpation.         Pelvimetry   Diagonal: Reached.  Spines: Average.  Sacrum: Concave.  Pubic Arch: Normal.      Assessment:    G2P1001 Patient Active Problem List   Diagnosis Date Noted  . Postpartum care following vaginal delivery 09/17/2015  . Prolonged spontaneous rupture of membranes 09/15/2015  . Irregular contractions 08/22/2015  . Pregnancy 06/21/2015  . Obesity affecting pregnancy in second trimester 06/21/2015  . Decreased fetal movement 06/21/2015     1. Second trimester pregnancy   2. BMI 45.0-49.9, adult (HCC)   3. Poor personal hygiene   4. Insufficient prenatal care in second trimester   5. History of macrosomia in infant in prior pregnancy, currently pregnant        Plan:            Prenatal Plan 1.  The patient was given prenatal literature. 2.  She was continued on prenatal vitamins. 3.  A prenatal lab panel was ordered or drawn. 4.  An ultrasound was ordered to better determine an EDC and FAS 5.  Quad screen 6.  Genetic testing and testing for other inheritable conditions discussed in detail. She will decide in the future whether to have these labs performed. 7.  A general overview of pregnancy testing, visit schedule, ultrasound schedule, and prenatal care was discussed. 8.  Schedule early 1 hour GCT 9.  Schedule anesthesia consult for increased BMI  Orders Orders Placed This Encounter  Procedures  .  Urine Culture  . GC/Chlamydia Probe Amp  . US OB Comp Less 14 Wks  . ABO AND RH   . CBC with Differential/Platelet  . Hepatitis B surface antigen  . HIV antibody  . Monitor Drug Profile 14(MW)  . Varicella zoster antibody, IgG  . Urinalysis, Routine w reflex microscopic  . Sickle cell screen  . Rubella screen  . RPR  . Hemoglobin A1c  . TSH  . Glucose, 1 hour gestational  . AFP TETRA  . Antibody screen    No orders of the defined types were placed in this encounter.     F/U  Return in about 4 weeks (around 09/14/2017).  Elonda Husky, M.D. 08/17/2017 9:03 AM

## 2017-08-18 LAB — URINALYSIS, ROUTINE W REFLEX MICROSCOPIC
BILIRUBIN UA: NEGATIVE
GLUCOSE, UA: NEGATIVE
KETONES UA: NEGATIVE
Leukocytes, UA: NEGATIVE
NITRITE UA: NEGATIVE
Protein, UA: NEGATIVE
RBC UA: NEGATIVE
SPEC GRAV UA: 1.018 (ref 1.005–1.030)
UUROB: 0.2 mg/dL (ref 0.2–1.0)
pH, UA: >=9 — AB (ref 5.0–7.5)

## 2017-08-18 LAB — PAP IG W/ RFLX HPV ASCU: PAP Smear Comment: 0

## 2017-08-19 LAB — AFP TETRA
DIA Mom Value: 1.56
DIA VALUE (EIA): 196.46 pg/mL
DSR (By Age)    1 IN: 1124
DSR (SECOND TRIMESTER) 1 IN: 8690
GESTATIONAL AGE AFP: 18 wk
MSAFP Mom: 1.47
MSAFP: 49.7 ng/mL
MSHCG Mom: 0.6
MSHCG: 11579 m[IU]/mL
Maternal Age At EDD: 22.2 yr
OSB RISK: 5931
T18 (By Age): 1:4377 {titer}
Test Results:: NEGATIVE
UE3 MOM: 0.98
WEIGHT: 257 [lb_av]
uE3 Value: 1.13 ng/mL

## 2017-08-19 LAB — ABO AND RH: Rh Factor: POSITIVE

## 2017-08-19 LAB — CBC WITH DIFFERENTIAL/PLATELET
BASOS ABS: 0 10*3/uL (ref 0.0–0.2)
Basos: 0 %
EOS (ABSOLUTE): 0.1 10*3/uL (ref 0.0–0.4)
Eos: 1 %
Hematocrit: 34.2 % (ref 34.0–46.6)
Hemoglobin: 11 g/dL — ABNORMAL LOW (ref 11.1–15.9)
Immature Grans (Abs): 0 10*3/uL (ref 0.0–0.1)
Immature Granulocytes: 0 %
LYMPHS ABS: 1.6 10*3/uL (ref 0.7–3.1)
Lymphs: 18 %
MCH: 28.9 pg (ref 26.6–33.0)
MCHC: 32.2 g/dL (ref 31.5–35.7)
MCV: 90 fL (ref 79–97)
MONOCYTES: 4 %
MONOS ABS: 0.3 10*3/uL (ref 0.1–0.9)
Neutrophils Absolute: 7.2 10*3/uL — ABNORMAL HIGH (ref 1.4–7.0)
Neutrophils: 77 %
Platelets: 429 10*3/uL — ABNORMAL HIGH (ref 150–379)
RBC: 3.8 x10E6/uL (ref 3.77–5.28)
RDW: 14.6 % (ref 12.3–15.4)
WBC: 9.3 10*3/uL (ref 3.4–10.8)

## 2017-08-19 LAB — HIV ANTIBODY (ROUTINE TESTING W REFLEX): HIV SCREEN 4TH GENERATION: NONREACTIVE

## 2017-08-19 LAB — HEMOGLOBIN A1C
ESTIMATED AVERAGE GLUCOSE: 105 mg/dL
Hgb A1c MFr Bld: 5.3 % (ref 4.8–5.6)

## 2017-08-19 LAB — RUBELLA SCREEN: Rubella Antibodies, IGG: 2.26 index (ref >0.99)

## 2017-08-19 LAB — VARICELLA ZOSTER ANTIBODY, IGG: Varicella zoster IgG: <135 index — ABNORMAL LOW (ref >165)

## 2017-08-19 LAB — SICKLE CELL SCREEN: Sickle Cell Screen: NEGATIVE

## 2017-08-19 LAB — ANTIBODY SCREEN: ANTIBODY SCREEN: NEGATIVE

## 2017-08-19 LAB — TSH: TSH: 0.813 u[IU]/mL (ref 0.450–4.500)

## 2017-08-19 LAB — URINE CULTURE: Organism ID, Bacteria: NO GROWTH

## 2017-08-19 LAB — RPR: RPR Ser Ql: NONREACTIVE

## 2017-08-19 LAB — GC/CHLAMYDIA PROBE AMP
CHLAMYDIA, DNA PROBE: NEGATIVE
NEISSERIA GONORRHOEAE BY PCR: NEGATIVE

## 2017-08-19 LAB — HEPATITIS B SURFACE ANTIGEN: HEP B S AG: NEGATIVE

## 2017-08-21 LAB — MONITOR DRUG PROFILE 14(MW)
AMPHETAMINE SCREEN URINE: NEGATIVE ng/mL
BARBITURATE SCREEN URINE: NEGATIVE ng/mL
BENZODIAZEPINE SCREEN, URINE: NEGATIVE ng/mL
Buprenorphine, Urine: NEGATIVE ng/mL
Cocaine (Metab) Scrn, Ur: NEGATIVE ng/mL
Creatinine(Crt), U: 115.3 mg/dL (ref 20.0–300.0)
Fentanyl, Urine: NEGATIVE pg/mL
MEPERIDINE SCREEN, URINE: NEGATIVE ng/mL
Methadone Screen, Urine: NEGATIVE ng/mL
OPIATE SCREEN URINE: NEGATIVE ng/mL
OXYCODONE+OXYMORPHONE UR QL SCN: NEGATIVE ng/mL
PH UR, DRUG SCRN: 8.7 (ref 4.5–8.9)
PROPOXYPHENE SCREEN URINE: NEGATIVE ng/mL
Phencyclidine Qn, Ur: NEGATIVE ng/mL
SPECIFIC GRAVITY: 1.023
TRAMADOL SCREEN, URINE: NEGATIVE ng/mL

## 2017-08-21 LAB — CANNABINOID (GC/MS), URINE: Cannabinoid: POSITIVE — AB

## 2017-08-23 ENCOUNTER — Encounter: Payer: Self-pay | Admitting: Obstetrics and Gynecology

## 2017-09-01 ENCOUNTER — Ambulatory Visit: Payer: Medicaid Other

## 2017-09-01 ENCOUNTER — Ambulatory Visit (INDEPENDENT_AMBULATORY_CARE_PROVIDER_SITE_OTHER): Payer: Medicaid Other

## 2017-09-01 DIAGNOSIS — Z3492 Encounter for supervision of normal pregnancy, unspecified, second trimester: Secondary | ICD-10-CM | POA: Diagnosis not present

## 2017-09-02 LAB — GLUCOSE, 1 HOUR GESTATIONAL: Gestational Diabetes Screen: 131 mg/dL (ref 65–139)

## 2017-09-12 ENCOUNTER — Observation Stay
Admission: EM | Admit: 2017-09-12 | Discharge: 2017-09-12 | Disposition: A | Discharge status: Home / Self Care | Payer: Medicaid Other | Attending: Obstetrics and Gynecology | Admitting: Obstetrics and Gynecology

## 2017-09-12 ENCOUNTER — Other Ambulatory Visit: Payer: Self-pay

## 2017-09-12 DIAGNOSIS — Z3A18 18 weeks gestation of pregnancy: Secondary | ICD-10-CM

## 2017-09-12 DIAGNOSIS — O219 Vomiting of pregnancy, unspecified: Principal | ICD-10-CM | POA: Insufficient documentation

## 2017-09-12 DIAGNOSIS — O26892 Other specified pregnancy related conditions, second trimester: Secondary | ICD-10-CM | POA: Insufficient documentation

## 2017-09-12 DIAGNOSIS — R112 Nausea with vomiting, unspecified: Secondary | ICD-10-CM | POA: Diagnosis not present

## 2017-09-12 DIAGNOSIS — R824 Acetonuria: Secondary | ICD-10-CM | POA: Insufficient documentation

## 2017-09-12 LAB — URINALYSIS, ROUTINE W REFLEX MICROSCOPIC
BILIRUBIN URINE: NEGATIVE
Glucose, UA: NEGATIVE mg/dL
Hgb urine dipstick: NEGATIVE
Ketones, ur: 20 mg/dL — AB
Nitrite: NEGATIVE
PH: 6 (ref 5.0–8.0)
Protein, ur: NEGATIVE mg/dL
SPECIFIC GRAVITY, URINE: 1.017 (ref 1.005–1.030)

## 2017-09-12 LAB — COMPREHENSIVE METABOLIC PANEL
ALK PHOS: 66 U/L (ref 38–126)
ALT: 11 U/L — AB (ref 14–54)
AST: 21 U/L (ref 15–41)
Albumin: 3.3 g/dL — ABNORMAL LOW (ref 3.5–5.0)
Anion gap: 10 (ref 5–15)
BILIRUBIN TOTAL: 0.4 mg/dL (ref 0.3–1.2)
CALCIUM: 8.8 mg/dL — AB (ref 8.9–10.3)
CO2: 21 mmol/L — ABNORMAL LOW (ref 22–32)
CREATININE: 0.46 mg/dL (ref 0.44–1.00)
Chloride: 106 mmol/L (ref 101–111)
Glucose, Bld: 109 mg/dL — ABNORMAL HIGH (ref 65–99)
Potassium: 3.4 mmol/L — ABNORMAL LOW (ref 3.5–5.1)
Sodium: 137 mmol/L (ref 135–145)
Total Protein: 7 g/dL (ref 6.5–8.1)

## 2017-09-12 LAB — CBC
HEMATOCRIT: 32.8 % — AB (ref 35.0–47.0)
HEMOGLOBIN: 11 g/dL — AB (ref 12.0–16.0)
MCH: 29.8 pg (ref 26.0–34.0)
MCHC: 33.4 g/dL (ref 32.0–36.0)
MCV: 89.4 fL (ref 80.0–100.0)
Platelets: 345 10*3/uL (ref 150–440)
RBC: 3.67 MIL/uL — AB (ref 3.80–5.20)
RDW: 14.7 % — ABNORMAL HIGH (ref 11.5–14.5)
WBC: 8.7 10*3/uL (ref 3.6–11.0)

## 2017-09-12 MED ORDER — DEXTROSE IN LACTATED RINGERS 5 % IV SOLN: INTRAVENOUS | Status: DC

## 2017-09-12 MED ORDER — DEXTROSE IN LACTATED RINGERS 5 % IV SOLN
INTRAVENOUS | Status: DC
  Administered 2017-09-12: 19:00:00 via INTRAVENOUS

## 2017-09-12 MED ORDER — ONDANSETRON HCL 4 MG/2ML IJ SOLN
4.0000 mg | Freq: Once | INTRAMUSCULAR | Status: AC
  Administered 2017-09-12: 4 mg via INTRAVENOUS
  Filled 2017-09-12: qty 2

## 2017-09-12 NOTE — Progress Notes (Signed)
Lab here to draw ordered Labs.

## 2017-09-12 NOTE — Discharge Instructions (Signed)
If you have any more Nausea and vomiting and cannot tolerate any food and/or fluids, follow up with your Provider on Monday. If your symptoms are severe please go to the Emergency room.

## 2017-09-12 NOTE — Progress Notes (Signed)
Dr. Greggory Keenefrancesco called unit and CBC and U/A  results reported as well as Pt.'s stated relief of N&V. Fetal Heart tone rate and Pt. VS also reported. Order received to discharge Pt. Home received.

## 2017-09-12 NOTE — Final Progress Note (Signed)
L&D OB Triage Note  SUBJECTIVE Kerry Hester is a 22 y.o. 772P1001 female at 6924w4d, EDD Estimated Date of Delivery: 02/09/18 who presented to triage with complaints of nausea and vomiting. No UTI symptoms. No N/V/D. No vaginal bleeding or pelvic pain.   OBJECTIVE Nursing Evaluation: BP (!) 117/52 (BP Location: Left Arm)   Pulse 96   Temp 98.6 F (37 C) (Oral)   Resp 20   LMP 05/13/2017  no significant findings for acute infection  NST was performed and has been reviewed by me. Urinalysis is abnormal for ketones  NST INTERPRETATION: Indications: rule out uterine contractions  CBC    Component Value Date/Time   WBC 8.7 09/12/2017 1935   RBC 3.67 (L) 09/12/2017 1935   HGB 11.0 (L) 09/12/2017 1935   HGB 11.0 (L) 08/17/2017 0908   HCT 32.8 (L) 09/12/2017 1935   HCT 34.2 08/17/2017 0908   PLT 345 09/12/2017 1935   PLT 429 (H) 08/17/2017 0908   MCV 89.4 09/12/2017 1935   MCV 90 08/17/2017 0908   MCV 91 10/06/2011 0943   MCH 29.8 09/12/2017 1935   MCHC 33.4 09/12/2017 1935   RDW 14.7 (H) 09/12/2017 1935   RDW 14.6 08/17/2017 0908   RDW 13.1 10/06/2011 0943   LYMPHSABS 1.6 08/17/2017 0908   MONOABS 0.5 04/01/2015 0223   EOSABS 0.1 08/17/2017 0908   BASOSABS 0.0 08/17/2017 0908   CMP Latest Ref Rng & Units 09/12/2017 05/23/2017 09/15/2015  Glucose 65 - 99 mg/dL 161(W109(H) 87 960(A104(H)  BUN 6 - 20 mg/dL <5(W<5(L) 7 9  Creatinine 0.980.44 - 1.00 mg/dL 1.190.46 1.470.63 8.290.63  Sodium 135 - 145 mmol/L 137 135 138  Potassium 3.5 - 5.1 mmol/L 3.4(L) 4.2 4.2  Chloride 101 - 111 mmol/L 106 101 109  CO2 22 - 32 mmol/L 21(L) 25 22  Calcium 8.9 - 10.3 mg/dL 5.6(O8.8(L) 9.3 9.2  Total Protein 6.5 - 8.1 g/dL 7.0 1.3(Y8.2(H) 6.4(L)  Total Bilirubin 0.3 - 1.2 mg/dL 0.4 0.4 0.4  Alkaline Phos 38 - 126 U/L 66 67 229(H)  AST 15 - 41 U/L 21 17 20   ALT 14 - 54 U/L 11(L) 19 11(L)  Urine dipstick shows negative for all components, positive for ketones.  Mode: Doppler Baseline Rate (A): 170 bpm               ASSESSMENT Impression:  1. Pregnancy:  G2P1001 at 9024w4d , EDD Estimated Date of Delivery: 02/09/18 2.  Monitoring reveals a reassuring fetal heart rate 3.  Nausea and vomiting in pregnancy with ketonuria  PLAN 1.  Patient was given IV fluids-D5 LR times 1 L 2.  Patient was given Zofran 3.  Patient is discharged to home and is to follow-up if symptoms recur.  She is to take Diclegis for nausea and vomiting  Herold HarmsMartin A Gaylin Osoria, MD

## 2017-09-12 NOTE — OB Triage Note (Addendum)
Patient here for nausea and vomiting since 11 am, she is having back pain and abd tenderness, she states that she normally can feel baby more but today she has not felt him move. She has had her flu vaccine, took tylenol yesterday afternoon for low grade fever and has no cough or diarrhea, she denies LOF or bleeding.

## 2017-09-12 NOTE — Progress Notes (Signed)
Afeb. VSS. Color good,, skin w&d. Pt. Has stated relief from Zofran and states she feels, " So much better, and the baby is moving really good." FHT is 170 and Fetal movement felt. Abd. Is soft and Pt. Denies contractions. No leakage of fluid and no vaginal bleeding.

## 2017-09-12 NOTE — Plan of Care (Signed)
Pt. Being observed at [redacted] weeks Gestation for Hx. Of N&V. Pt. Is alert and oriented with Cheerful afect. Color good, skin w&d. BBS clear. Afeb. VSS. Has had stated relief of N&V after IV Zofran. Waiting Lab results. Pt. States good Fetal movement and FHR is 170. Abd. Is soft and there is no leakage of fluids or bleeding from vagina. Will cont to follow closely.

## 2017-09-14 ENCOUNTER — Telehealth: Payer: Self-pay

## 2017-09-14 ENCOUNTER — Ambulatory Visit (INDEPENDENT_AMBULATORY_CARE_PROVIDER_SITE_OTHER): Payer: Medicaid Other | Admitting: Obstetrics and Gynecology

## 2017-09-14 VITALS — BP 114/73 | HR 86 | Wt 260.0 lb

## 2017-09-14 DIAGNOSIS — O9989 Other specified diseases and conditions complicating pregnancy, childbirth and the puerperium: Secondary | ICD-10-CM

## 2017-09-14 DIAGNOSIS — Z3A18 18 weeks gestation of pregnancy: Secondary | ICD-10-CM

## 2017-09-14 DIAGNOSIS — H9201 Otalgia, right ear: Secondary | ICD-10-CM

## 2017-09-14 DIAGNOSIS — O0992 Supervision of high risk pregnancy, unspecified, second trimester: Secondary | ICD-10-CM

## 2017-09-14 DIAGNOSIS — Z1379 Encounter for other screening for genetic and chromosomal anomalies: Secondary | ICD-10-CM

## 2017-09-14 DIAGNOSIS — O99213 Obesity complicating pregnancy, third trimester: Secondary | ICD-10-CM

## 2017-09-14 LAB — URINE CULTURE

## 2017-09-14 NOTE — Progress Notes (Signed)
Pt is doing well.

## 2017-09-14 NOTE — Progress Notes (Signed)
ROB: Patient c/o of right ear pain x 2 weeks.  Denies much drainage from ear. Denies recent sick contacts.  Notes sometimes pain is also felt behind her right eye.  Exam notes mild bogginess of TM but otherwise normal.  Discussed cessation of use of q-tips, advised on Claritin/Sudafed.  Discussed morbid obesity in pregnancy, need for Anesthesia consult in pregnancy, appropriate weight gain in pregnancy, and will refer to nutritionist. Also discussed BMI cut-off at [facility]. Has had normal anatomy scan. Discussed genetic testing, patient initially noted that she had not had testing, but on review of labs had an AFP tetra performed (however dates were incorrect at that time). Will contact [facility] to see if lab can be recalculated. Had normal early glucola, needs repeat at 28 weeks. Up to date for flu vaccine (received at [facility]). Medicaid form completed today. RTC in 4 weeks.

## 2017-09-16 LAB — AFP TETRA
DIA MOM VALUE: 1.86
DIA VALUE (EIA): 238.06 pg/mL
DSR (By Age)    1 IN: 1124
DSR (SECOND TRIMESTER) 1 IN: 9529
GESTATIONAL AGE AFP: 18.6 wk
MATERNAL AGE AT EDD: 22.2 a
MSAFP Mom: 1.66
MSAFP: 60.8 ng/mL
MSHCG Mom: 0.41
MSHCG: 7318 m[IU]/mL
OSB RISK: 3604
TEST RESULTS AFP: NEGATIVE
UE3 MOM: 1.55
WEIGHT: 260 [lb_av]
uE3 Value: 2.01 ng/mL

## 2017-09-21 NOTE — Progress Notes (Signed)
Pt was sent a letter informing her that she needed to complete an 1 hr gtt and also that her genetic testing was normal.

## 2017-10-07 NOTE — Telephone Encounter (Signed)
Pt aware.

## 2017-10-12 ENCOUNTER — Encounter: Payer: Medicaid Other | Admitting: Obstetrics and Gynecology

## 2017-11-02 ENCOUNTER — Encounter: Payer: Medicaid Other | Admitting: Obstetrics and Gynecology

## 2017-11-23 ENCOUNTER — Ambulatory Visit (INDEPENDENT_AMBULATORY_CARE_PROVIDER_SITE_OTHER): Payer: Medicaid Other | Admitting: Obstetrics and Gynecology

## 2017-11-23 ENCOUNTER — Encounter: Payer: Self-pay | Admitting: Obstetrics and Gynecology

## 2017-11-23 VITALS — BP 122/74 | HR 93 | Wt 262.4 lb

## 2017-11-23 DIAGNOSIS — Z131 Encounter for screening for diabetes mellitus: Secondary | ICD-10-CM

## 2017-11-23 DIAGNOSIS — Z3A32 32 weeks gestation of pregnancy: Secondary | ICD-10-CM

## 2017-11-23 DIAGNOSIS — O0933 Supervision of pregnancy with insufficient antenatal care, third trimester: Secondary | ICD-10-CM

## 2017-11-23 DIAGNOSIS — O0993 Supervision of high risk pregnancy, unspecified, third trimester: Secondary | ICD-10-CM

## 2017-11-23 DIAGNOSIS — Z130 Encounter for screening for diseases of the blood and blood-forming organs and certain disorders involving the immune mechanism: Secondary | ICD-10-CM

## 2017-11-23 LAB — POCT URINALYSIS DIPSTICK
Bilirubin, UA: NEGATIVE
Blood, UA: NEGATIVE
Glucose, UA: NEGATIVE
KETONES UA: NEGATIVE
NITRITE UA: NEGATIVE
ODOR: NEGATIVE
PH UA: 7.5 (ref 5.0–8.0)
Spec Grav, UA: 1.01 (ref 1.010–1.025)
UROBILINOGEN UA: 0.2 U/dL

## 2017-11-23 MED ORDER — TETANUS-DIPHTH-ACELL PERTUSSIS 5-2.5-18.5 LF-MCG/0.5 IM SUSP
0.5000 mL | Freq: Once | INTRAMUSCULAR | Status: AC
  Administered 2017-11-23: 0.5 mL via INTRAMUSCULAR

## 2017-11-23 NOTE — Progress Notes (Signed)
ROB- gtt, btc, and tdap- done.  

## 2017-11-23 NOTE — Progress Notes (Signed)
ROB: Patient has not come to appointments in 10 weeks.  Says that she has had transportation issues.  She says this will not be a problem going forward.  1 hour GCT Tdap today.  Anesthesia consult scheduled for after 35 weeks for BMI.  Recommend repeat urine for tox screen after 35 weeks.  Begin NSTs at 36 weeks

## 2017-11-24 LAB — CBC
HEMOGLOBIN: 10.4 g/dL — AB (ref 11.1–15.9)
Hematocrit: 31.8 % — ABNORMAL LOW (ref 34.0–46.6)
MCH: 28.3 pg (ref 26.6–33.0)
MCHC: 32.7 g/dL (ref 31.5–35.7)
MCV: 86 fL (ref 79–97)
Platelets: 484 10*3/uL — ABNORMAL HIGH (ref 150–379)
RBC: 3.68 x10E6/uL — AB (ref 3.77–5.28)
RDW: 15.5 % — ABNORMAL HIGH (ref 12.3–15.4)
WBC: 10.1 10*3/uL (ref 3.4–10.8)

## 2017-11-24 LAB — RPR: RPR Ser Ql: NONREACTIVE

## 2017-11-24 LAB — GLUCOSE, 1 HOUR GESTATIONAL: GESTATIONAL DIABETES SCREEN: 103 mg/dL (ref 65–139)

## 2017-12-07 ENCOUNTER — Encounter: Payer: Medicaid Other | Admitting: Obstetrics and Gynecology

## 2017-12-08 ENCOUNTER — Other Ambulatory Visit: Payer: Self-pay

## 2017-12-08 ENCOUNTER — Inpatient Hospital Stay
Admission: EM | Admit: 2017-12-08 | Discharge: 2017-12-08 | Disposition: A | Discharge status: Home / Self Care | Payer: Medicaid Other | Attending: Obstetrics and Gynecology | Admitting: Obstetrics and Gynecology

## 2017-12-08 DIAGNOSIS — O26893 Other specified pregnancy related conditions, third trimester: Secondary | ICD-10-CM | POA: Insufficient documentation

## 2017-12-08 DIAGNOSIS — R112 Nausea with vomiting, unspecified: Secondary | ICD-10-CM | POA: Insufficient documentation

## 2017-12-08 DIAGNOSIS — Z3A35 35 weeks gestation of pregnancy: Secondary | ICD-10-CM | POA: Insufficient documentation

## 2017-12-08 DIAGNOSIS — R10A1 Flank pain, right side: Secondary | ICD-10-CM

## 2017-12-08 DIAGNOSIS — R109 Unspecified abdominal pain: Secondary | ICD-10-CM | POA: Diagnosis not present

## 2017-12-08 LAB — URINALYSIS, ROUTINE W REFLEX MICROSCOPIC
BILIRUBIN URINE: NEGATIVE
GLUCOSE, UA: NEGATIVE mg/dL
Hgb urine dipstick: NEGATIVE
KETONES UR: NEGATIVE mg/dL
LEUKOCYTES UA: NEGATIVE
Nitrite: NEGATIVE
PH: 8 (ref 5.0–8.0)
Protein, ur: NEGATIVE mg/dL
SPECIFIC GRAVITY, URINE: 1.015 (ref 1.005–1.030)

## 2017-12-08 NOTE — Discharge Instructions (Signed)
Drink plenty of water and get plenty of rest. Call your provider for any other concerns °

## 2017-12-08 NOTE — OB Triage Note (Signed)
Patient presented to L&D with complaints of right sided abdominal pain that started 2;00 this morning and has progressively gotten worse.  Denies vaginal bleeding, leaking of fluid but states baby hasn't felt as much fetal movement as usual.  States she feels like she needs to urinate but "can't get enough out"  Complaining of pain in her right upper back also.

## 2017-12-08 NOTE — Final Progress Note (Signed)
L&D OB Triage Note  SUBJECTIVE Kerry Hester is a 22 y.o. G51P1001 female at [redacted]w[redacted]d, EDD Estimated Date of Delivery: 01/10/18 who presented to triage with complaints of right flank pain for several days.  She also has associated nausea and vomiting which has been ongoing throughout pregnancy without recent exacerbation.  She has been noting irregular contractions approximately every 10 minutes.  She reports good fetal movement.  Preeclampsia symptoms are denied.  Past Medical History:  Diagnosis Date  . Arthritis    History reviewed. No pertinent surgical history.  ROS: Comprehensive review of systems is negative except for that noted in the HPI   OBJECTIVE MD Evaluation: BP (!) 121/59 (BP Location: Left Arm)   Pulse 92   Temp 98.1 F (36.7 C) (Oral)   Resp 20   Ht  (1.575 m)   Wt 260 lb (117.9 kg)   BMI 47.55 kg/m  Well-appearing female in no acute distress.  Alert and oriented. Back: No CVA tenderness Abdomen: Gravid; fundal height not measured but consistent with third trimester pregnancy; uterus nontender Cervix: Long/soft/fingertip/-3/vertex/bag of water intact Extremities: Warm, dry, 1+ edema  NST was performed and has been reviewed by me.  NST INTERPRETATION: Indications: Right flank pain  Mode: External Baseline Rate (A): 135 bpm Variability: Moderate Accelerations: 15 x 15 Decelerations: None     Contraction Frequency (min): 4-5  ASSESSMENT Impression:  1. Pregnancy:  G2P1001 at [redacted]w[redacted]d , EDD Estimated Date of Delivery: 01/10/18 2.  reactive NST 3.  No evidence of renal lithiasis/UTI/Pylo  PLAN 1. Reassurance given 2. Discharge home with bleeding/labor precautions.  3. Continue routine prenatal care.  Next appointment 12/14/2017   Herold Harms, MD   Note: This dictation was prepared with Dragon dictation along with smaller phrase technology. Any transcriptional errors that result from this process are unintentional.

## 2017-12-14 ENCOUNTER — Encounter: Payer: Medicaid Other | Admitting: Obstetrics and Gynecology

## 2017-12-16 ENCOUNTER — Telehealth: Payer: Self-pay | Admitting: Obstetrics and Gynecology

## 2017-12-16 NOTE — Telephone Encounter (Signed)
Mel was called and was not available LM for Mel to call back.

## 2017-12-16 NOTE — Telephone Encounter (Signed)
Mel from [employer] is handling the patients Disability claim  And need to confirm with Dr. Valentino Saxon or her nurse that the patient has been taken out of work until delivery due to complications, with the start date of 12/14/17. Mel's call back number is [phone].

## 2017-12-22 NOTE — Telephone Encounter (Signed)
Mel was called no answer LM to call back.

## 2017-12-30 NOTE — Telephone Encounter (Signed)
Mel was called no answer left message voicemail to return the call.

## 2018-01-04 ENCOUNTER — Ambulatory Visit (INDEPENDENT_AMBULATORY_CARE_PROVIDER_SITE_OTHER): Payer: Medicaid Other | Admitting: Obstetrics and Gynecology

## 2018-01-04 ENCOUNTER — Encounter: Payer: Self-pay | Admitting: Obstetrics and Gynecology

## 2018-01-04 VITALS — BP 114/70 | HR 73 | Wt 259.8 lb

## 2018-01-04 DIAGNOSIS — O0933 Supervision of pregnancy with insufficient antenatal care, third trimester: Secondary | ICD-10-CM

## 2018-01-04 DIAGNOSIS — O0930 Supervision of pregnancy with insufficient antenatal care, unspecified trimester: Secondary | ICD-10-CM

## 2018-01-04 DIAGNOSIS — O0993 Supervision of high risk pregnancy, unspecified, third trimester: Secondary | ICD-10-CM

## 2018-01-04 DIAGNOSIS — O212 Late vomiting of pregnancy: Secondary | ICD-10-CM | POA: Diagnosis not present

## 2018-01-04 DIAGNOSIS — O219 Vomiting of pregnancy, unspecified: Secondary | ICD-10-CM

## 2018-01-04 DIAGNOSIS — O99213 Obesity complicating pregnancy, third trimester: Secondary | ICD-10-CM

## 2018-01-04 DIAGNOSIS — Z6841 Body Mass Index (BMI) 40.0 and over, adult: Secondary | ICD-10-CM

## 2018-01-04 DIAGNOSIS — Z3A39 39 weeks gestation of pregnancy: Secondary | ICD-10-CM

## 2018-01-04 DIAGNOSIS — Z3685 Encounter for antenatal screening for Streptococcus B: Secondary | ICD-10-CM

## 2018-01-04 DIAGNOSIS — Z113 Encounter for screening for infections with a predominantly sexual mode of transmission: Secondary | ICD-10-CM

## 2018-01-04 NOTE — Progress Notes (Signed)
ROB-pt stated that she has a lot of questions for the doctor concerning the delivery of the baby no other concerns.

## 2018-01-04 NOTE — Progress Notes (Addendum)
ROB: Patient has missed several appointments, cites car trouble as reason. Notes she was unaware of Medicaid provided free transportationservices. Notes nausea/vomiting for past 2 weeks (mostly in the mornings). Will prescribe Zofran. Patient reports that she was never contacted regarding her Anesthesia consult.  Review of chart notes consultation requested in April.  Will resubmit request today.  Body mass index is 47.52 kg/m.  NST performed today for morbid obesity. Growth scan ordered for later this week. 3rd trimester labs done today. Patient unable to void today. Scheduled on 01/10/2018 for induction at 0800.  Marland Kitchen.   NONSTRESS TEST INTERPRETATION  INDICATIONS: Obesity (morbid)  FHR baseline: 140 bpm RESULTS:Reactive COMMENTS: Occasional contractions   PLAN: 1. Continue fetal kick counts twice a day. 2. Scheduled for IOL on Monday 01/10/2018.

## 2018-01-05 ENCOUNTER — Other Ambulatory Visit: Payer: Self-pay | Admitting: Obstetrics and Gynecology

## 2018-01-05 ENCOUNTER — Encounter (INDEPENDENT_AMBULATORY_CARE_PROVIDER_SITE_OTHER): Payer: Self-pay

## 2018-01-05 DIAGNOSIS — Z6841 Body Mass Index (BMI) 40.0 and over, adult: Principal | ICD-10-CM

## 2018-01-05 DIAGNOSIS — O99213 Obesity complicating pregnancy, third trimester: Secondary | ICD-10-CM

## 2018-01-06 ENCOUNTER — Ambulatory Visit (INDEPENDENT_AMBULATORY_CARE_PROVIDER_SITE_OTHER): Payer: Medicaid Other

## 2018-01-06 DIAGNOSIS — O99213 Obesity complicating pregnancy, third trimester: Secondary | ICD-10-CM

## 2018-01-06 DIAGNOSIS — Z6841 Body Mass Index (BMI) 40.0 and over, adult: Secondary | ICD-10-CM | POA: Diagnosis not present

## 2018-01-06 LAB — STREP GP B NAA: STREP GROUP B AG: NEGATIVE

## 2018-01-06 LAB — GC/CHLAMYDIA PROBE AMP
CHLAMYDIA, DNA PROBE: POSITIVE — AB
Neisseria gonorrhoeae by PCR: NEGATIVE

## 2018-01-10 ENCOUNTER — Other Ambulatory Visit: Payer: Self-pay

## 2018-01-10 ENCOUNTER — Inpatient Hospital Stay
Admission: EM | Admit: 2018-01-10 | Discharge: 2018-01-10 | DRG: 832 | Disposition: A | Discharge status: Other Acute Inpatient Hospital | Payer: Medicaid Other | Attending: Obstetrics and Gynecology | Admitting: Obstetrics and Gynecology

## 2018-01-10 DIAGNOSIS — Z8619 Personal history of other infectious and parasitic diseases: Secondary | ICD-10-CM

## 2018-01-10 DIAGNOSIS — O9882 Other maternal infectious and parasitic diseases complicating childbirth: Secondary | ICD-10-CM | POA: Diagnosis present

## 2018-01-10 DIAGNOSIS — O480 Post-term pregnancy: Secondary | ICD-10-CM | POA: Diagnosis present

## 2018-01-10 DIAGNOSIS — O99214 Obesity complicating childbirth: Secondary | ICD-10-CM | POA: Diagnosis present

## 2018-01-10 DIAGNOSIS — A562 Chlamydial infection of genitourinary tract, unspecified: Secondary | ICD-10-CM | POA: Diagnosis present

## 2018-01-10 DIAGNOSIS — Z3A40 40 weeks gestation of pregnancy: Secondary | ICD-10-CM

## 2018-01-10 DIAGNOSIS — O0990 Supervision of high risk pregnancy, unspecified, unspecified trimester: Secondary | ICD-10-CM

## 2018-01-10 DIAGNOSIS — O0930 Supervision of pregnancy with insufficient antenatal care, unspecified trimester: Secondary | ICD-10-CM

## 2018-01-10 HISTORY — DX: Personal history of other infectious and parasitic diseases: Z86.19

## 2018-01-10 LAB — URINE DRUG SCREEN, QUALITATIVE (ARMC ONLY)
Amphetamines, Ur Screen: NOT DETECTED
Benzodiazepine, Ur Scrn: NOT DETECTED
COCAINE METABOLITE, UR ~~LOC~~: NOT DETECTED
Cannabinoid 50 Ng, Ur ~~LOC~~: POSITIVE — AB
MDMA (ECSTASY) UR SCREEN: NOT DETECTED
Methadone Scn, Ur: NOT DETECTED
OPIATE, UR SCREEN: NOT DETECTED
PHENCYCLIDINE (PCP) UR S: NOT DETECTED
Tricyclic, Ur Screen: NOT DETECTED

## 2018-01-10 LAB — CBC
HEMATOCRIT: 31.7 % — AB (ref 35.0–47.0)
Hemoglobin: 10.5 g/dL — ABNORMAL LOW (ref 12.0–16.0)
MCH: 28.5 pg (ref 26.0–34.0)
MCHC: 33.2 g/dL (ref 32.0–36.0)
MCV: 85.7 fL (ref 80.0–100.0)
Platelets: 398 10*3/uL (ref 150–440)
RBC: 3.7 MIL/uL — ABNORMAL LOW (ref 3.80–5.20)
RDW: 16.8 % — AB (ref 11.5–14.5)
WBC: 11.7 10*3/uL — ABNORMAL HIGH (ref 3.6–11.0)

## 2018-01-10 LAB — TYPE AND SCREEN
ABO/RH(D): B POS
ANTIBODY SCREEN: NEGATIVE

## 2018-01-10 MED ORDER — OXYTOCIN 40 UNITS IN LACTATED RINGERS INFUSION - SIMPLE MED: 1.0000 m[IU]/min | INTRAVENOUS | Status: DC

## 2018-01-10 MED ORDER — TERBUTALINE SULFATE 1 MG/ML IJ SOLN: 0.2500 mg | Freq: Once | INTRAMUSCULAR | Status: DC | PRN

## 2018-01-10 MED ORDER — BUTORPHANOL TARTRATE 2 MG/ML IJ SOLN: 1.0000 mg | INTRAMUSCULAR | Status: DC | PRN

## 2018-01-10 MED ORDER — ACETAMINOPHEN 325 MG PO TABS: 650.0000 mg | ORAL_TABLET | ORAL | Status: DC | PRN

## 2018-01-10 MED ORDER — LIDOCAINE HCL (PF) 1 % IJ SOLN
30.0000 mL | INTRAMUSCULAR | Status: DC | PRN
  Filled 2018-01-10: qty 30

## 2018-01-10 MED ORDER — LACTATED RINGERS IV SOLN
INTRAVENOUS | Status: DC
  Administered 2018-01-10 (×2): via INTRAVENOUS

## 2018-01-10 MED ORDER — OXYTOCIN BOLUS FROM INFUSION: 500.0000 mL | Freq: Once | INTRAVENOUS | Status: DC

## 2018-01-10 MED ORDER — AZITHROMYCIN 500 MG PO TABS
1000.0000 mg | ORAL_TABLET | Freq: Once | ORAL | Status: AC
  Administered 2018-01-10: 1000 mg via ORAL
  Filled 2018-01-10: qty 2

## 2018-01-10 MED ORDER — OXYTOCIN 40 UNITS IN LACTATED RINGERS INFUSION - SIMPLE MED
2.5000 [IU]/h | INTRAVENOUS | Status: DC
  Filled 2018-01-10: qty 1000

## 2018-01-10 MED ORDER — ONDANSETRON HCL 4 MG/2ML IJ SOLN: 4.0000 mg | Freq: Four times a day (QID) | INTRAMUSCULAR | Status: DC | PRN

## 2018-01-10 MED ORDER — MISOPROSTOL 50MCG HALF TABLET
50.0000 ug | ORAL_TABLET | ORAL | Status: DC | PRN
  Administered 2018-01-10: 50 ug via VAGINAL
  Filled 2018-01-10: qty 1

## 2018-01-10 MED ORDER — OXYCODONE-ACETAMINOPHEN 5-325 MG PO TABS: 2.0000 | ORAL_TABLET | ORAL | Status: DC | PRN

## 2018-01-10 MED ORDER — OXYCODONE-ACETAMINOPHEN 5-325 MG PO TABS: 1.0000 | ORAL_TABLET | ORAL | Status: DC | PRN

## 2018-01-10 MED ORDER — SOD CITRATE-CITRIC ACID 500-334 MG/5ML PO SOLN: 30.0000 mL | ORAL | Status: DC | PRN

## 2018-01-10 MED ORDER — LACTATED RINGERS IV SOLN: 500.0000 mL | INTRAVENOUS | Status: DC | PRN

## 2018-01-10 NOTE — Progress Notes (Signed)
Report called to [facility]: Eileen StanfordJenna, RN and [facility] transport : Derrick, eta for transport 1 hour.

## 2018-01-10 NOTE — Consult Note (Signed)
Kerry Hester Anesthesia Consultation  Kerry Hester WUJ:[id number] DOB: 01/12/1996 DOA: 01/10/2018 PCP: System, Pcp Not In   Requesting physician: Dr. Valentino Saxonherry Date of consultation: 01/10/18  Reason for consultation: Obesity during pregnancy  CHIEF COMPLAINT:  Obesity during pregnancy  HISTORY OF PRESENT ILLNESS: Kerry Hester  is a 22 y.o. female with a known history of super morbid obesity in pregnancy.  PAST MEDICAL HISTORY:   Past Medical History:  Diagnosis Date  . Arthritis     PAST SURGICAL HISTORY: History reviewed. No pertinent surgical history.  SOCIAL HISTORY:  Social History   Tobacco Use  . Smoking status: Never Smoker  . Smokeless tobacco: Never Used  Substance Use Topics  . Alcohol use: No    Frequency: Never    FAMILY HISTORY: No family history on file.  DRUG ALLERGIES: No Known Allergies  REVIEW OF SYSTEMS:   RESPIRATORY: No cough, shortness of breath, wheezing.  CARDIOVASCULAR: No chest pain, orthopnea, edema.  HEMATOLOGY: No anemia, easy bruising or bleeding SKIN: No rash or lesion. NEUROLOGIC: No tingling, numbness, weakness.  PSYCHIATRY: No anxiety or depression.   MEDICATIONS AT HOME:  Prior to Admission medications   Medication Sig Start Date End Date Taking? Authorizing Provider  Prenatal Vit-Fe Fumarate-FA (MULTIVITAMIN-PRENATAL) 27-0.8 MG TABS tablet Take 1 tablet by mouth daily at 12 noon.    [provider]      PHYSICAL EXAMINATION:   VITAL SIGNS: Blood pressure 135/69, pulse 69, temperature 37.6 C, temperature source Oral, resp. rate 16, height 5' (1.524 m), weight 265 lb (120.2 kg).  GENERAL:  22 y.o.-year-old patient no acute distress.  Patient is super morbidly obese.  HEENT: Head atraumatic, normocephalic. Oropharynx and nasopharynx clear. MP 4, TM distance <3 cm, normal mouth opening. LUNGS: No use of accessory muscles of respiration.  PSYCHIATRIC: The patient is alert and  oriented x 3.  SKIN: No obvious rash, lesion, or ulcer.    IMPRESSION AND PLAN:   Kerry Hester  is a 22 y.o. female presenting with obesity during pregnancy. BMI is currently 52 at [redacted] weeks gestation who is presenting for induction of labor.  Patient was never seen by anesthesia during her pregnancy due to several issues, but has presented today for an induction.  I went to see the patient as her weight makes her concerning for difficulty with management of her pain during labor as well as for airway manipulation in the event of a STAT c-section.  Given her MP 4 airway and short thyromental distance as well as the soft tissue edema seen during pregnancy it is my opinion that this patient poses the risk of being a very challenging airway.  [facility] is not a high risk OB [facility] and this patient qualifies as being high risk.  Therefore it is my opinion that she should not be induced at this institution and would be better cared for at a higher level of care for her safety as well as the safety of her child.  If you have questions or concerns please do not hesitate to call me directly or the anesthesia office.

## 2018-01-10 NOTE — Progress Notes (Signed)
Intrapartum Progress Note  S: Patient starting to feel her contractions.   O: Blood pressure 135/69, pulse 69, temperature 99.6 F (37.6 C), temperature source Oral, resp. rate 16, height 5' (1.524 m), weight 265 lb (120.2 kg). Gen App: NAD, comfortable Abdomen: soft, gravid FHT: baseline 130 bpm.  Accels present.  Decels absent. moderate in degree variability.   Tocometer: contractions q 2-4 minutes Cervix: 3/60/-2 Extremities: Nontender, no edema.  Pitocin: None  Labs:  No new labs   Assessment:  1: SIUP at 1325w0d 2. Morbid obesity (BMI 51) 3. Chlamydia in pregnancy   Plan:  1. Patient seen and evaluated by Anesthesiology who now deems that patient has a difficult airway, and necessitates transfer to a tertiary care facility.  Discussion had with patient regarding recommendations for transfer.  [facility] Will arrange transfer.  2. Treated for chlamydia with Azithromycin PO prior to start of induction.     Hildred Laserherry, Nicci Vaughan, MD 01/10/2018 6:38 PM

## 2018-01-10 NOTE — Progress Notes (Signed)
   01/10/18 1100  Clinical Encounter Type  Visited With Patient and family together  Visit Type Initial (OR: Prayer)  Referral From Nurse  Consult/Referral To Chaplain  Spiritual Encounters  Spiritual Needs Prayer;Emotional  Stress Factors  Patient Stress Factors  (Lack of progression in labor after induction)   CH visited with patient.  Pt's partner was present, but not interested in conversation. Patient had come in this morning to be induced, with 2nd pregnancy, and was feeling somewhat anxious since "nothing was happening". She compared this preg to her first and thought things would go quicker with induction.  Overall, the patient was great to talk to, [facility] and [facility] talked about being pregnant, etc. Patient was still undecided about the child's name and we talked about the naming process and the weight of that decision. I offered to pray for the pt and her baby boy. Prayed for quick and safe delivery and discernment about his name.  I also put in a request to talk with the pt's nurse, Tresa EndoKelly, about allowing the pt to move around if possible to help her feel less anxious and stagnant while waiting for the baby to arrive.

## 2018-01-10 NOTE — Progress Notes (Signed)
Intrapartum Progress Note  S: Patient feeling nauseated, just had an episode of vomiting.  Notes that "it's because me and the baby are hungry".   O: Blood pressure 140/81, pulse 92, temperature 98.8 F (37.1 C), temperature source Oral, resp. rate 18, height 5' (1.524 m), weight 265 lb (120.2 kg). Gen App: NAD, comfortable Abdomen: soft, gravid FHT: baseline 135 bpm.  Accels present.  Decels present (just after episode of vomiting, from baseline to 110s, lasted for ~ 2 minutes, with return to baseline). moderate in degree variability.   Tocometer: contractions irregular Cervix: deferred Extremities: Nontender, no edema.  Pitocin: None  Labs:  Results for orders placed or performed during the hospital encounter of 01/10/18  CBC  Result Value Ref Range   WBC 11.7 (H) 3.6 - 11.0 K/uL   RBC 3.70 (L) 3.80 - 5.20 MIL/uL   Hemoglobin 10.5 (L) 12.0 - 16.0 g/dL   HCT 40.931.7 (L) 81.135.0 - 91.447.0 %   MCV 85.7 80.0 - 100.0 fL   MCH 28.5 26.0 - 34.0 pg   MCHC 33.2 32.0 - 36.0 g/dL   RDW 78.216.8 (H) 95.611.5 - 21.314.5 %   Platelets 398 150 - 440 K/uL  Type and screen  Result Value Ref Range   ABO/RH(D) B POS    Antibody Screen NEG    Sample Expiration      01/13/2018 Performed at [facility], [address] [place], [place] [address]       Assessment:  1: SIUP at 1574w0d 2. Morbid obesity 3. Category 2 tracing  Plan:  1. Continue IOL for morbid obesity.  Is s/p initial dose of Cytotec at 1100.  Next dose due in 1 hour.   2. Continue to monitor fetal tracing. Patient currently in left lateral positioning. Tracing has become more reassuring.  May have been related to vomiting episode.  Will give anti-emetic as needed.  3. Anticipate vaginal delivery .   Hildred Laserherry, Harlo Jaso, MD 01/10/2018 2:22 PM

## 2018-01-10 NOTE — H&P (Signed)
Obstetric History and Physical  Kerry Hester is a 22 y.o. G2P1001 with IUP at [redacted]w[redacted]d presenting for induction of labor secondary to morbid obesity in pregnancy. Patient states she has been having  Irregular non-painful contractions, none vaginal bleeding, intact membranes, with active fetal movement.    Prenatal Course Source of Care: [facility] with onset of care at 13 weeks Pregnancy complications or risks: Patient Active Problem List   Diagnosis Date Noted  . Supervision of high risk pregnancy, antepartum 01/10/2018  . Chlamydia trachomatis infection in pregnancy in third trimester 01/10/2018  . Limited prenatal care, antepartum 01/04/2018  . Indication for care in labor and delivery, antepartum 09/12/2017  . Morbid obesity (HCC) 06/21/2015   She plans to bottle feed She desires undecided method for postpartum contraception.   Prenatal labs and studies: ABO, Rh: B/Positive/-- (01/22 0908) Antibody: Negative (01/22 0908) Rubella: 2.26 (01/22 0908) RPR: Non Reactive (04/30 1702)  HBsAg: Negative (01/22 0908)  HIV: Non Reactive (01/22 0908)  ZOX:WRUEAVWU (06/11 1643) 1 hr Glucola  normal Genetic screening normal Anatomy US normal   Past Medical History:  Diagnosis Date  . Arthritis     History reviewed. No pertinent surgical history.   No family history on file.   OB History  Gravida Para Term Preterm AB Living  2 1 1     1   SAB TAB Ectopic Multiple Live Births        0 1    # Outcome Date GA Lbr Len/2nd Weight Sex Delivery Anes PTL Lv  2 Current           1 Term 09/15/15 [redacted]w[redacted]d / 01:26 8 lb 6 oz (3.8 kg) F Vag-Spont EPI  LIV    Social History   Socioeconomic History  . Marital status: Single    Spouse name: Not on file  . Number of children: Not on file  . Years of education: Not on file  . Highest education level: Not on file  Occupational History  . Not on file  Social Needs  . Financial resource strain: Not on file  . Food  insecurity:    Worry: Not on file    Inability: Not on file  . Transportation needs:    Medical: Not on file    Non-medical: Not on file  Tobacco Use  . Smoking status: Never Smoker  . Smokeless tobacco: Never Used  Substance and Sexual Activity  . Alcohol use: No    Frequency: Never  . Drug use: No  . Sexual activity: Yes    Birth control/protection: Surgical    Comment: considering tubal  Lifestyle  . Physical activity:    Days per week: Not on file    Minutes per session: Not on file  . Stress: Not on file  Relationships  . Social connections:    Talks on phone: Not on file    Gets together: Not on file    Attends religious service: Not on file    Active member of club or organization: Not on file    Attends meetings of clubs or organizations: Not on file    Relationship status: Not on file  Other Topics Concern  . Not on file  Social History Narrative  . Not on file     Medications Prior to Admission  Medication Sig Dispense Refill Last Dose  . Prenatal Vit-Fe Fumarate-FA (MULTIVITAMIN-PRENATAL) 27-0.8 MG TABS tablet Take 1 tablet by mouth daily at 12 noon.   Taking  No Known Allergies  Review of Systems: Negative except for what is mentioned in HPI.  Physical Exam: BP 128/61   Pulse 80   Temp 98.8 F (37.1 C) (Oral)   Resp 18   Ht 5' (1.524 m)   Wt 265 lb (120.2 kg)   BMI 51.75 kg/m  CONSTITUTIONAL: Well-developed, well-nourished female in no acute distress.  HENT:  Normocephalic, atraumatic, External right and left ear normal. Oropharynx is clear and moist EYES: Conjunctivae and EOM are normal. Pupils are equal, round, and reactive to light. No scleral icterus.  NECK: Normal range of motion, supple, no masses SKIN: Skin is warm and dry. No rash noted. Not diaphoretic. No erythema. No pallor. NEUROLOGIC: Alert and oriented to person, place, and time. Normal reflexes, muscle tone coordination. No cranial nerve deficit noted. PSYCHIATRIC: Normal mood  and affect. Normal behavior. Normal judgment and thought content. CARDIOVASCULAR: Normal heart rate noted, regular rhythm RESPIRATORY: Effort and breath sounds normal, no problems with respiration noted ABDOMEN: Soft, nontender, nondistended, gravid. MUSCULOSKELETAL: Normal range of motion. No edema and no tenderness. 2+ distal pulses.  Cervical Exam: Dilatation 2 cm   Effacement 40%   Station -3   Presentation: cephalic FHT:  Baseline rate 145 bpm   Variability moderate  Accelerations present (however only 10 x 10)  Decelerations none Contractions: Irregular, 5-10 mins   Pertinent Labs/Studies:   No results found for this or any previous visit (from the past 24 hour(s)).  Routine Prenatal on 01/04/2018  Component Date Value Ref Range Status  . Chlamydia trachomatis, NAA 01/04/2018 Positive* Negative Final  . Neisseria gonorrhoeae by PCR 01/04/2018 Negative  Negative Final  . Strep Gp B NAA 01/04/2018 Negative  Negative Final   Comment: Centers for Disease Control and Prevention (CDC) and American Congress of Obstetricians and Gynecologists (ACOG) guidelines for prevention of perinatal group B streptococcal (GBS) disease specify co-collection of a vaginal and rectal swab specimen to maximize sensitivity of GBS detection. Per the CDC and ACOG, swabbing both the lower vagina and rectum substantially increases the yield of detection compared with sampling the vagina alone. Penicillin G, ampicillin, or cefazolin are indicated for intrapartum prophylaxis of perinatal GBS colonization. Reflex susceptibility testing should be performed prior to use of clindamycin only on GBS isolates from penicillin-allergic women who are considered a high risk for anaphylaxis. Treatment with vancomycin without additional testing is warranted if resistance to clindamycin is noted.     Assessment : Kerry Hester is a 22 y.o. G2P1001 at 4074w0d being admitted for induction of labor due to morbid obesity.   Also with a h/o limited PNC this pregnancy, and newly diagnosed Chlamydia.  Plan: Labor: Induction with Cytotec, as ordered as per protocol. Analgesia as needed.  Informed patient of Chlamydia infection, will treat with PO Azithromycin, advised on partner treatment.  FWB: Reassuring fetal heart tracing.  GBS negative Delivery plan: Hopeful for vaginal delivery   Kerry Hester, Kerry Creswell, MD [facility]

## 2018-01-11 LAB — RPR: RPR Ser Ql: NONREACTIVE

## 2018-01-15 NOTE — Discharge Summary (Signed)
    OB Discharge Summary     Patient Name: Kerry Hester DOB: [birth year] MRN: [id number]  Date of admission: 01/10/2018 Delivering MD:     Date of discharge: 01/15/2018  Admitting diagnosis: 40 wks preg contractions induction Intrauterine pregnancy: 775w5d     Secondary diagnosis:  Principal Problem:   Indication for care in labor and delivery, antepartum Active Problems:   Supervision of high risk pregnancy, antepartum   Morbid obesity (HCC)   Limited prenatal care, antepartum   Chlamydia trachomatis infection in pregnancy in third trimester  Additional problems:      Discharge diagnosis: Morbid obesity, Chlamydia infection in 3rd trimester                                  Augmentation: Cytotec  Complications: None  Hospital course: 22 y.o. yo G2P1001 at [redacted]w[redacted]d was admitted to the hospital 01/10/2018 for induction of labor due to morbid obesity.  At time of her admission, it was noted that she had Chlamydia.  She was treated with PO Azithromycin.  Due to patient's obesity, she was deemed higher risk by Anesthesiology, and was recommended for transfer to a tertiary center.  [facility] her [facility].   Physical exam  Vitals:   01/10/18 1726 01/10/18 1900 01/10/18 1955 01/10/18 2002  BP: 135/69  (!) 129/55   Pulse: 69  79   Resp: 16 18    Temp: 98.2 F (36.8 C) 98.1 F (36.7 C)  98.1 F (36.7 C)  TempSrc: Oral Oral  Oral  Weight:      Height:       General: alert and no distress Lochia: appropriate Uterine Fundus: firm Incision: None DVT Evaluation: No evidence of DVT seen on physical exam. Labs: Lab Results  Component Value Date   WBC 11.7 (H) 01/10/2018   HGB 10.5 (L) 01/10/2018   HCT 31.7 (L) 01/10/2018   MCV 85.7 01/10/2018   PLT 398 01/10/2018   CMP Latest Ref Rng & Units 09/12/2017  Glucose 65 - 99 mg/dL 147(W109(H)  BUN 6 - 20 mg/dL <2(N<5(L)  Creatinine 5.620.44 - 1.00 mg/dL 1.300.46  Sodium 865135 - 784145 mmol/L 137  Potassium  3.5 - 5.1 mmol/L 3.4(L)  Chloride 101 - 111 mmol/L 106  CO2 22 - 32 mmol/L 21(L)  Calcium 8.9 - 10.3 mg/dL 6.9(G8.8(L)  Total Protein 6.5 - 8.1 g/dL 7.0  Total Bilirubin 0.3 - 1.2 mg/dL 0.4  Alkaline Phos 38 - 126 U/L 66  AST 15 - 41 U/L 21  ALT 14 - 54 U/L 11(L)    After visit meds:  Allergies as of 01/10/2018   No Known Allergies     Medication List    ASK your doctor about these medications   multivitamin-prenatal 27-0.8 MG Tabs tablet Take 1 tablet by mouth daily at 12 noon.       Diet: liquid diet  Outpatient follow EX:BMWUXLKup:Patient to f/u 6 weeks postpartum if vaginal delivery, 1 week postpartum for incision check if C-section delivery. Follow up Appt:No future appointments. Follow up Visit:No follow-ups on file.   01/15/2018 Hildred LaserAnika Jeorge Reister, MD

## 2018-01-26 ENCOUNTER — Other Ambulatory Visit: Payer: Self-pay

## 2018-01-26 NOTE — Telephone Encounter (Signed)
Pt was called and informed that her FLMA paperwork was completed and faxed.

## 2018-02-09 ENCOUNTER — Encounter (INDEPENDENT_AMBULATORY_CARE_PROVIDER_SITE_OTHER): Payer: Self-pay

## 2018-02-23 ENCOUNTER — Ambulatory Visit (INDEPENDENT_AMBULATORY_CARE_PROVIDER_SITE_OTHER): Payer: Medicaid Other | Admitting: Obstetrics and Gynecology

## 2018-02-23 ENCOUNTER — Encounter: Payer: Self-pay | Admitting: Obstetrics and Gynecology

## 2018-02-23 ENCOUNTER — Other Ambulatory Visit (HOSPITAL_COMMUNITY)
Admission: RE | Admit: 2018-02-23 | Discharge: 2018-02-23 | Disposition: A | Discharge status: Home / Self Care | Payer: Medicaid Other | Source: Ambulatory Visit | Attending: Obstetrics and Gynecology | Admitting: Obstetrics and Gynecology

## 2018-02-23 VITALS — BP 123/82 | HR 93 | Ht 60.0 in | Wt 241.5 lb

## 2018-02-23 DIAGNOSIS — A749 Chlamydial infection, unspecified: Secondary | ICD-10-CM

## 2018-02-23 DIAGNOSIS — D649 Anemia, unspecified: Secondary | ICD-10-CM

## 2018-02-23 DIAGNOSIS — Z3009 Encounter for other general counseling and advice on contraception: Secondary | ICD-10-CM

## 2018-02-23 DIAGNOSIS — O98813 Other maternal infectious and parasitic diseases complicating pregnancy, third trimester: Secondary | ICD-10-CM

## 2018-02-23 DIAGNOSIS — Z3A00 Weeks of gestation of pregnancy not specified: Secondary | ICD-10-CM | POA: Insufficient documentation

## 2018-02-23 DIAGNOSIS — A568 Sexually transmitted chlamydial infection of other sites: Secondary | ICD-10-CM

## 2018-02-23 DIAGNOSIS — K649 Unspecified hemorrhoids: Secondary | ICD-10-CM

## 2018-02-23 DIAGNOSIS — O98313 Other infections with a predominantly sexual mode of transmission complicating pregnancy, third trimester: Principal | ICD-10-CM

## 2018-02-23 MED ORDER — HYDROCORTISONE 2.5 % RE CREA: 1.0000 "application " | TOPICAL_CREAM | Freq: Two times a day (BID) | RECTAL | 0 refills | Status: AC

## 2018-02-23 NOTE — Progress Notes (Signed)
   OBSTETRICS POSTPARTUM CLINIC PROGRESS NOTE  Subjective:     Kerry Hester is a 22 y.o. [id number] female who presents for a postpartum visit. She is 6 weeks postpartum following a spontaneous vaginal delivery (transferred [facility] to [facility] due morbid obesity in pregnancy). I have fully reviewed the prenatal and intrapartum course.  Intrapartum course was complicated by recent Chlamydia infection. The delivery was at 40 gestational weeks.  Anesthesia: epidural. Postpartum course has been well. Baby's course has been well. Baby is feeding by bottle. Bleeding: patient has not resumed menses, with No LMP recorded. Bowel function is normal. Bladder function is normal. Patient is sexually active. Contraception: patient is considering Mirena IUD. Postpartum depression screening: negative, PHQ-9 =1.  The following portions of the patient's history were reviewed and updated as appropriate: allergies, current medications, past family history, past medical history, past social history, past surgical history and problem list.  Review of Systems A comprehensive review of systems was negative except for: Gastrointestinal: positive for positive for hemorrhoids. Has been using Preparation-H and increasing fruit intake without much relief.    Objective:    BP 123/82   Pulse 93   Ht 5' (1.524 m)   Wt 241 lb 8 oz (109.5 kg)   Breastfeeding? No   BMI 47.16 kg/m   General:  alert and no distress   Breasts:  inspection negative, no nipple discharge or bleeding, no masses or nodularity palpable  Lungs: clear to auscultation bilaterally  Heart:  regular rate and rhythm, S1, S2 normal, no murmur, click, rub or gallop  Abdomen: soft, non-tender; bowel sounds normal; no masses,  no organomegaly.    Vulva:  normal  Vagina: normal vagina, no discharge, exudate, lesion, or erythema  Cervix:  no cervical motion tenderness and no lesions  Corpus: normal size, contour, position, consistency, mobility, non-tender    Adnexa:  normal adnexa and no mass, fullness, tenderness  Rectal Exam: 2 small external hemorrhoids present         Labs:  Lab Results  Component Value Date   HGB 10.5 (L) 01/10/2018   Routine Prenatal on 01/04/2018  Component Date Value Ref Range Status  . Chlamydia trachomatis, NAA 01/04/2018 Positive* Negative Final  . Neisseria gonorrhoeae by PCR 01/04/2018 Negative  Negative Final    Assessment:   Routine postpartum exam (SVD).   H/o chlamydia infection Hemorrhoids Contraception management   Plan:    1. Contraception: IUD.  Answered all of patient's questions and addressed concerns regarding Mirena IUD.  Patient now feels better about decision, will return in 2 weeks for placement.  2. Mild postpartum anemia, asymptomatic. Will not recheck Hgb.  3. H/o Chlamydia, treated in L&D at time of induction. For TOC today.  4. Hemorrhoids - discussed conservative management with sitz baths, increasing fiber, witch hazel, and can prescribe Anu-Sol if no other options give relief.  5.  Follow up in: 2 weeks or as needed.    Hildred Laserherry, Jacquese Hackman, MD [facility]

## 2018-02-23 NOTE — Progress Notes (Signed)
Pt stated that she was doing well. EDPS=1

## 2018-02-25 LAB — URINE CYTOLOGY ANCILLARY ONLY
Chlamydia: NEGATIVE
NEISSERIA GONORRHEA: NEGATIVE

## 2018-03-09 ENCOUNTER — Encounter: Payer: Medicaid Other | Admitting: Obstetrics and Gynecology

## 2018-03-31 ENCOUNTER — Encounter: Payer: Self-pay | Admitting: Obstetrics and Gynecology

## 2018-03-31 ENCOUNTER — Ambulatory Visit (INDEPENDENT_AMBULATORY_CARE_PROVIDER_SITE_OTHER): Payer: Medicaid Other | Admitting: Obstetrics and Gynecology

## 2018-03-31 VITALS — BP 137/81 | HR 73 | Ht 60.0 in | Wt 238.0 lb

## 2018-03-31 DIAGNOSIS — O99345 Other mental disorders complicating the puerperium: Secondary | ICD-10-CM

## 2018-03-31 DIAGNOSIS — F419 Anxiety disorder, unspecified: Secondary | ICD-10-CM

## 2018-03-31 DIAGNOSIS — Z3202 Encounter for pregnancy test, result negative: Secondary | ICD-10-CM | POA: Diagnosis not present

## 2018-03-31 DIAGNOSIS — Z30430 Encounter for insertion of intrauterine contraceptive device: Secondary | ICD-10-CM

## 2018-03-31 DIAGNOSIS — F53 Postpartum depression: Secondary | ICD-10-CM

## 2018-03-31 LAB — POCT URINE PREGNANCY: Preg Test, Ur: NEGATIVE

## 2018-03-31 MED ORDER — SERTRALINE HCL 50 MG PO TABS: 50.0000 mg | ORAL_TABLET | Freq: Every day | ORAL | 3 refills | Status: AC

## 2018-03-31 NOTE — Progress Notes (Signed)
GYNECOLOGY PROGRESS NOTE  Subjective:    Patient ID: Kerry Hester, female    DOB: October 11, 1995, 22 y.o.   MRN: [id number]  HPI  Patient is a 22 y.o. G42P2002 female who is approximately 3 months postpartum who presents for complaints of just not feeling right.  She states that she no longer has pleasure in doing things that she used to enjoy.  She also notes that actually is feeling sad more days than not, and has sometimes noticed herself crying more (and patient notes that she is usually not an emotional person).  She notes that she has had to call out of her job several times last week just not being able to find the motivation to get out of bed.  Reports loss of appetite, possibly eating only one meal a day, if that.  States that she has tried to deal by talking with family and going to church more, which has not helped. Denies SI/HI.  Notes that she does still get up to make sure that she takes care of her children.  She lastly reports that she is noticing an increase in her anxiety levels, sometimes noticing an occasional tremor in her hands. EPDS score is 12.   Of note, patient wonders if she will be able to receive her IUD today.  She was supposed to come in approximately 1 month ago however did not keep her appointment.  Last intercourse was approximately 1 week ago, unprotected.  Her last menstrual cycle ended approximately 3 days ago.    The following portions of the patient's history were reviewed and updated as appropriate: allergies, current medications, past family history, past medical history, past social history, past surgical history and problem list.  Review of Systems Pertinent items noted in HPI and remainder of comprehensive ROS otherwise negative.   Objective:   Blood pressure 137/81, pulse 73, height 5' (1.524 m), weight 238 lb (108 kg), not currently breastfeeding. General appearance: alert and no distress Abdomen: soft, non-tender; bowel sounds normal; no masses,   no organomegaly Pelvic: external genitalia normal, rectovaginal septum normal.  Vagina with small amount of thin white mucoid discharge.  Cervix normal appearing, no lesions and no motion tenderness.  Uterus mobile, nontender, normal shape and size.  Adnexae non-palpable, nontender bilaterally.  Neurologic: Grossly normal  Psychiatric: normal mood, thought content.  Slightly depressed affect, emotional upon discussing symptoms.    Labs:  Results for orders placed or performed in visit on 03/31/18  POCT urine pregnancy  Result Value Ref Range   Preg Test, Ur Negative Negative      Assessment:   Postpartum depression  Anxiety Contraception management (IUD placement)  Plan:   - Postpartum depression and anxiety - Counseled that based on EPDS screening she did have a positive screen for depression.  Patient had screening recommendations would be for therapeutic counseling, antidepressant medication, or both.  Discussed risks and benefits of all options.  Patient notes that she is willing to try both.  Will refer to counselor.  Also will initiate Zoloft 50 mg daily.  Discussed that she may not notice any significant improvement in her symptoms until after the first 2 weeks of treatment however maximum results are usually noted by 4 weeks.  Patient notes understanding. -Patient desires to have IUD placed today for contraception.  Last intercourse was approximately a week ago with last cycle ending approximately 3 days ago.  UPT negative today.  Will place Mirena IUD at patient's request.  See  insertion note below. -Patient to follow-up in 4 weeks for IUD string check, and follow-up of depression symptoms.      GYNECOLOGY OFFICE PROCEDURE NOTE  Keyanni LEINA BABE is a 22 y.o. [id number] here for Mirena IUD insertion. Last pap smear was on 08/17/2017 and was normal.  IUD Insertion Procedure Note Patient identified, informed consent performed, consent signed.   Discussed risks of irregular  bleeding, cramping, infection, malpositioning or misplacement of the IUD outside the uterus which may require further procedure such as laparoscopy. Time out was performed.  Urine pregnancy test negative.  Speculum placed in the vagina.  Cervix visualized.  Cleaned with Betadine x 2.  Grasped anteriorly with a single tooth tenaculum.  Uterus sounded to 9 cm.  Mirena IUD placed per manufacturer's recommendations.  Strings trimmed to 3 cm. Tenaculum was removed, good hemostasis noted.  Patient tolerated procedure well.   Patient was given post-procedure instructions.  She was advised to have backup contraception for one week.  Patient was also asked to check IUD strings periodically and follow up in 4 weeks for IUD check.    Hildred Laser, MD [facility]

## 2018-03-31 NOTE — Progress Notes (Signed)
PT is present today for her postpartum visit. Pt stated that she is not breastfeeding and had sexually intercourse recently. Pt stated that she would like to get the Mirena for birth control. EPDS= 12.  Pt is getting a Mirena inserted today.  UPT-neg. Pt stated that she is doing well no complaints.

## 2018-03-31 NOTE — Patient Instructions (Addendum)
Intrauterine Device Insertion, Care After This sheet gives you information about how to care for yourself after your procedure. Your health care provider may also give you more specific instructions. If you have problems or questions, contact your health care provider. What can I expect after the procedure? After the procedure, it is common to have:  Cramps and pain in the abdomen.  Light bleeding (spotting) or heavier bleeding that is like your menstrual period. This may last for up to a few days.  Lower back pain.  Dizziness.  Headaches.  Nausea.  Follow these instructions at home:  Before resuming sexual activity, check to make sure that you can feel the IUD string(s). You should be able to feel the end of the string(s) below the opening of your cervix. If your IUD string is in place, you may resume sexual activity. ? If you had a hormonal IUD inserted more than 7 days after your most recent period started, you will need to use a backup method of birth control for 7 days after IUD insertion. Ask your health care provider whether this applies to you.  Continue to check that the IUD is still in place by feeling for the string(s) after every menstrual period, or once a month.  Take over-the-counter and prescription medicines only as told by your health care provider.  Do not drive or use heavy machinery while taking prescription pain medicine.  Keep all follow-up visits as told by your health care provider. This is important. Contact a health care provider if:  You have bleeding that is heavier or lasts longer than a normal menstrual cycle.  You have a fever.  You have cramps or abdominal pain that get worse or do not get better with medicine.  You develop abdominal pain that is new or is not in the same area of earlier cramping and pain.  You feel lightheaded or weak.  You have abnormal or bad-smelling discharge from your vagina.  You have pain during sexual  activity.  You have any of the following problems with your IUD string(s): ? The string bothers or hurts you or your sexual partner. ? You cannot feel the string. ? The string has gotten longer.  You can feel the IUD in your vagina.  You think you may be pregnant, or you miss your menstrual period.  You think you may have an STI (sexually transmitted infection). Get help right away if:  You have flu-like symptoms.  You have a fever and chills.  You can feel that your IUD has slipped out of place. Summary  After the procedure, it is common to have cramps and pain in the abdomen. It is also common to have light bleeding (spotting) or heavier bleeding that is like your menstrual period.  Continue to check that the IUD is still in place by feeling for the string(s) after every menstrual period, or once a month.  Keep all follow-up visits as told by your health care provider. This is important.  Contact your health care provider if you have problems with your IUD string(s), such as the string getting longer or bothering you or your sexual partner. This information is not intended to replace advice given to you by your health care provider. Make sure you discuss any questions you have with your health care provider. Document Released: 03/11/2011 Document Revised: 06/03/2016 Document Reviewed: 06/03/2016 Elsevier Interactive Patient Education  2017 [employer]    Perinatal Depression When a woman feels excessive sadness, anger, or  anxiety during pregnancy or during the first 12 months after she gives birth, she has a condition called perinatal depression. Depression can interfere with work, school, relationships, and other everyday activities. If it is not managed properly, it can also cause problems in the mother and her baby. Sometimes, perinatal depression is left untreated because symptoms are thought to be normal mood swings during and right after pregnancy. If you have symptoms  of depression, it is important to talk with your health care provider. What are the causes? The exact cause of this condition is not known. Hormonal changes during and after pregnancy may play a role in causing perinatal depression. What increases the risk? You are more likely to develop this condition if:  You have a personal or family history of depression, anxiety, or mood disorders.  You experience a stressful life event during pregnancy, such as the death of a loved one.  You have a lot of regular life stress.  You do not have support from family members or loved ones, or you are in an abusive relationship.  What are the signs or symptoms? Symptoms of this condition include:  Feeling sad or hopeless.  Feelings of guilt.  Feeling irritable or overwhelmed.  Changes in your appetite.  Lack of energy or motivation.  Sleep problems.  Difficulty concentrating or completing tasks.  Loss of interest in hobbies or relationships.  Headaches or stomach problems that do not go away.  How is this diagnosed? This condition is diagnosed based on a physical exam and mental evaluation. In some cases, your health care provider may use a depression screening tool. These tools include a list of questions that can help a health care provider diagnose depression. Your health care provider may refer you to a mental health expert who specializes in depression. How is this treated? This condition may be treated with:  Medicines. Your health care provider will only give you medicines that have been proven safe for pregnancy and breastfeeding.  Talk therapy with a mental health professional to help change your patterns of thinking (cognitive behavioral therapy).  Support groups.  Brain stimulation or light therapies.  Stress reduction therapies, such as mindfulness.  Follow these instructions at home: Lifestyle  Do not use any products that contain nicotine or tobacco, such as  cigarettes and e-cigarettes. If you need help quitting, ask your health care provider.  Do not use alcohol when you are pregnant. After your baby is born, limit alcohol intake to no more than 1 drink a day. One drink equals 12 oz of beer, 5 oz of wine, or 1 oz of hard liquor.  Consider joining a support group for new mothers. Ask your health care provider for recommendations.  Take good care of yourself. Make sure you: ? Get plenty of sleep. If you are having trouble sleeping, talk with your health care provider. ? Eat a healthy diet. This includes plenty of fruits and vegetables, whole grains, and lean proteins. ? Exercise regularly, as told by your health care provider. Ask your health care provider what exercises are safe for you. General instructions  Take over-the-counter and prescription medicines only as told by your health care provider.  Talk with your partner or family members about your feelings during pregnancy. Share any concerns or anxieties that you may have.  Ask for help with tasks or chores when you need it. Ask friends and family members to provide meals, watch your children, or help with cleaning.  Keep all follow-up visits  as told by your health care provider. This is important. Contact a health care provider if:  You (or people close to you) notice that you have any symptoms of depression.  You have depression and your symptoms get worse.  You experience side effects from medicines, such as nausea or sleep problems. Get help right away if:  You feel like hurting yourself, your baby, or someone else. If you ever feel like you may hurt yourself or others, or have thoughts about taking your own life, get help right away. You can go to your nearest emergency department or call:  Your local emergency services (911 in the [place]  A suicide crisis helpline, such as the National Suicide Prevention Lifeline at (534)558-9117. This is open 24 hours a  day.  Summary  Perinatal depression is when a woman feels excessive sadness, anger, or anxiety during pregnancy or during the first 12 months after she gives birth.  If perinatal depression is not treated, it can lead to health problems for the mother and her baby.  This condition is treated with medicines, talk therapy, stress reduction therapies, or a combination of two or more treatments.  Talk with your partner or family members about your feelings. Do not be afraid to ask for help. This information is not intended to replace advice given to you by your health care provider. Make sure you discuss any questions you have with your health care provider. Document Released: 09/09/2016 Document Revised: 09/09/2016 Document Reviewed: 09/09/2016 Elsevier Interactive Patient Education  [employer].

## 2018-04-11 ENCOUNTER — Encounter: Payer: Self-pay | Admitting: Emergency Medicine

## 2018-04-11 ENCOUNTER — Emergency Department
Admission: EM | Admit: 2018-04-11 | Discharge: 2018-04-11 | Disposition: A | Discharge status: Home / Self Care | Payer: Self-pay | Attending: Emergency Medicine | Admitting: Emergency Medicine

## 2018-04-11 ENCOUNTER — Emergency Department: Payer: Self-pay

## 2018-04-11 DIAGNOSIS — Z79899 Other long term (current) drug therapy: Secondary | ICD-10-CM | POA: Insufficient documentation

## 2018-04-11 DIAGNOSIS — N3000 Acute cystitis without hematuria: Secondary | ICD-10-CM | POA: Insufficient documentation

## 2018-04-11 LAB — URINALYSIS, COMPLETE (UACMP) WITH MICROSCOPIC
Bilirubin Urine: NEGATIVE
Glucose, UA: NEGATIVE mg/dL
HGB URINE DIPSTICK: NEGATIVE
Ketones, ur: NEGATIVE mg/dL
Nitrite: NEGATIVE
PROTEIN: NEGATIVE mg/dL
SPECIFIC GRAVITY, URINE: 1.025 (ref 1.005–1.030)
pH: 5 (ref 5.0–8.0)

## 2018-04-11 LAB — COMPREHENSIVE METABOLIC PANEL
ALBUMIN: 4.4 g/dL (ref 3.5–5.0)
ALK PHOS: 104 U/L (ref 38–126)
ALT: 15 U/L (ref 0–44)
AST: 18 U/L (ref 15–41)
Anion gap: 8 (ref 5–15)
BILIRUBIN TOTAL: 0.5 mg/dL (ref 0.3–1.2)
BUN: 9 mg/dL (ref 6–20)
CALCIUM: 9.2 mg/dL (ref 8.9–10.3)
CO2: 24 mmol/L (ref 22–32)
Chloride: 107 mmol/L (ref 98–111)
Creatinine, Ser: 0.66 mg/dL (ref 0.44–1.00)
GFR calc Af Amer: >60 mL/min (ref >60)
GFR calc non Af Amer: >60 mL/min (ref >60)
GLUCOSE: 115 mg/dL — AB (ref 70–99)
POTASSIUM: 3.7 mmol/L (ref 3.5–5.1)
SODIUM: 139 mmol/L (ref 135–145)
Total Protein: 8 g/dL (ref 6.5–8.1)

## 2018-04-11 LAB — POCT PREGNANCY, URINE: Preg Test, Ur: NEGATIVE

## 2018-04-11 LAB — CBC
HEMATOCRIT: 34.4 % — AB (ref 35.0–47.0)
Hemoglobin: 12.1 g/dL (ref 12.0–16.0)
MCH: 30.5 pg (ref 26.0–34.0)
MCHC: 35.2 g/dL (ref 32.0–36.0)
MCV: 86.6 fL (ref 80.0–100.0)
Platelets: 402 10*3/uL (ref 150–440)
RBC: 3.97 MIL/uL (ref 3.80–5.20)
RDW: 17 % — AB (ref 11.5–14.5)
WBC: 12.9 10*3/uL — ABNORMAL HIGH (ref 3.6–11.0)

## 2018-04-11 LAB — LIPASE, BLOOD: Lipase: 23 U/L (ref 11–51)

## 2018-04-11 MED ORDER — CEPHALEXIN 500 MG PO CAPS: 500.0000 mg | ORAL_CAPSULE | Freq: Two times a day (BID) | ORAL | 0 refills | Status: AC

## 2018-04-11 MED ORDER — IOPAMIDOL (ISOVUE-300) INJECTION 61%
100.0000 mL | Freq: Once | INTRAVENOUS | Status: AC | PRN
  Administered 2018-04-11: 100 mL via INTRAVENOUS

## 2018-04-11 MED ORDER — SODIUM CHLORIDE 0.9 % IV BOLUS
1000.0000 mL | Freq: Once | INTRAVENOUS | Status: AC
  Administered 2018-04-11: 1000 mL via INTRAVENOUS

## 2018-04-11 MED ORDER — FENTANYL CITRATE (PF) 100 MCG/2ML IJ SOLN
50.0000 ug | INTRAMUSCULAR | Status: DC | PRN
  Administered 2018-04-11: 50 ug via INTRAVENOUS
  Filled 2018-04-11: qty 2

## 2018-04-11 MED ORDER — ONDANSETRON HCL 4 MG/2ML IJ SOLN
4.0000 mg | Freq: Once | INTRAMUSCULAR | Status: AC
  Administered 2018-04-11: 4 mg via INTRAVENOUS
  Filled 2018-04-11: qty 2

## 2018-04-11 NOTE — ED Triage Notes (Signed)
Patient presents to the ED with severe right lower quadrant pain that began 1 hour prior to arrival.  Patient is tearful at this time and guarding area.  Patient is 2 months post partum.  Patient reports vomiting x 3 in the past hour.  Patient states symptoms were very sudden.  Patient states, "I feel like I'm going to pass out."

## 2018-04-11 NOTE — ED Notes (Signed)
Pt instructed to notify RN as soon as she can provide urine sample

## 2018-04-11 NOTE — ED Notes (Signed)
Discussed possibility of radiographic or US study with Dr. Sharma CovertNorman.  Denies at this time.

## 2018-04-11 NOTE — ED Provider Notes (Signed)
[facility] Emergency Department Provider Note  ____________________________________________  Time seen: Approximately 6:05 PM  I have reviewed the triage vital signs and the nursing notes.   HISTORY  Chief Complaint Abdominal Pain    HPI Kerry Hester is a 22 y.o. female who presents to the emergency department for treatment and evaluation of right lower quadrant pain that started this afternoon. Pain has lessened while waiting for ER room assignment. At the worst point, pain was 10/10 sharp shooting. She has never had pain like that before. She had 3 episodes of vomiting, but no longer feels nauseated. She is 2 months postpartum. Vaginal delivery. IUD placed a few weeks ago. No known fever. No dysuria.   Past Medical History:  Diagnosis Date  . Arthritis   . History of chlamydia infection 01/10/2018    Patient Active Problem List   Diagnosis Date Noted  . History of chlamydia infection 01/10/2018  . Morbid obesity (HCC) 06/21/2015    History reviewed. No pertinent surgical history.  Prior to Admission medications   Medication Sig Start Date End Date Taking? Authorizing Provider  cephALEXin (KEFLEX) 500 MG capsule Take 1 capsule (500 mg total) by mouth 2 (two) times daily for 7 days. 04/11/18 04/18/18  Talisha Erby, Kasandra Knudsenari B, FNP  hydrocortisone (ANUSOL-HC) 2.5 % rectal cream Place 1 application rectally 2 (two) times daily. 02/23/18   Hildred Laserherry, Anika, MD  Prenatal Vit-Fe Fumarate-FA (MULTIVITAMIN-PRENATAL) 27-0.8 MG TABS tablet Take 1 tablet by mouth daily at 12 noon.    [provider]  sertraline (ZOLOFT) 50 MG tablet Take 1 tablet (50 mg total) by mouth daily. 03/31/18   Hildred Laserherry, Anika, MD    Allergies Patient has no known allergies.  No family history on file.  Social History Social History   Tobacco Use  . Smoking status: Never Smoker  . Smokeless tobacco: Never Used  Substance Use Topics  . Alcohol use: No    Frequency: Never  . Drug  use: No    Review of Systems Constitutional: Negative for fever. Respiratory: Negative for shortness of breath or cough. Gastrointestinal: Positive for abdominal pain; positive for nausea , positive for vomiting. Genitourinary: Negative for dysuria , negative for vaginal discharge. Musculoskeletal: Negative for back pain. Skin: Negative for acute skin changes/rash/lesion. ____________________________________________   PHYSICAL EXAM:  VITAL SIGNS: ED Triage Vitals  Enc Vitals Group     BP 04/11/18 1420 (!) 157/95     Pulse Rate 04/11/18 1420 83     Resp 04/11/18 1420 18     Temp 04/11/18 1420 98.7 F (37.1 C)     Temp Source 04/11/18 1420 Oral     SpO2 04/11/18 1420 98 %     Weight 04/11/18 1430 236 lb (107 kg)     Height 04/11/18 1430 5' (1.524 m)     Head Circumference --      Peak Flow --      Pain Score 04/11/18 1421 10     Pain Loc --      Pain Edu? --      Excl. in GC? --     Constitutional: Alert and oriented. Well appearing and in no acute distress. Eyes: Conjunctivae are normal. Head: Atraumatic. Nose: No congestion/rhinnorhea. Mouth/Throat: Mucous membranes are moist. Respiratory: Normal respiratory effort.  No retractions. Gastrointestinal: Bowel sounds active x 4; Abdomen is soft. Rebound tenderness in RLQ.  Genitourinary: Pelvic exam: not indicated. Musculoskeletal: No extremity tenderness nor edema.  Neurologic:  Normal speech and language. No  gross focal neurologic deficits are appreciated. Speech is normal. No gait instability. Skin:  Skin is warm, dry and intact. No rash noted on exposed skin. Psychiatric: Mood and affect are normal. Speech and behavior are normal.  ____________________________________________   LABS (all labs ordered are listed, but only abnormal results are displayed)  Labs Reviewed  COMPREHENSIVE METABOLIC PANEL - Abnormal; Notable for the following components:      Result Value   Glucose, Bld 115 (*)    All other components  within normal limits  CBC - Abnormal; Notable for the following components:   WBC 12.9 (*)    HCT 34.4 (*)    RDW 17.0 (*)    All other components within normal limits  URINALYSIS, COMPLETE (UACMP) WITH MICROSCOPIC - Abnormal; Notable for the following components:   Color, Urine YELLOW (*)    APPearance HAZY (*)    Leukocytes, UA LARGE (*)    Bacteria, UA FEW (*)    All other components within normal limits  LIPASE, BLOOD  POCT PREGNANCY, URINE  POC URINE PREG, ED   ____________________________________________  RADIOLOGY  CT abdomen and pelvis unremarkable. ____________________________________________  Procedures  ____________________________________________  22 year old female who presents to the emergency department for treatment and evaluation of abdominal pain. She had Fentanyl while waiting for ER bed assignment which has provided significant pain relief. WBC is noted to be slightly elevated at 12.9, otherwise labs are reassuring. UA is still pending. CT ordered.  Differential diagnosis includes, but is not limited to appendicitis, diverticulitis, PID, or ruptured ovarian cyst.  Patient discharged home. Findings indicate acute cystitis. She will be treated with Keflex and advised to follow up with [facility]. She was also advised to return to the ER for symptoms of concern if unable to schedule an appointment.  INITIAL IMPRESSION / ASSESSMENT AND PLAN / ED COURSE  Pertinent labs & imaging results that were available during my care of the patient were reviewed by me and considered in my medical decision making (see chart for details).  ____________________________________________   FINAL CLINICAL IMPRESSION(S) / ED DIAGNOSES  Final diagnoses:  Acute cystitis without hematuria    Note:  This document was prepared using Dragon voice recognition software and may include unintentional dictation errors.    Chinita Pester, FNP 04/11/18 2029    Minna Antis, MD 04/11/18 2330

## 2018-04-29 ENCOUNTER — Encounter: Payer: Medicaid Other | Admitting: Obstetrics and Gynecology

## 2018-11-13 IMAGING — CT CT ABD-PELV W/ CM
2 of 4 series · 17 of 46 positions shown, 19 images · IV contrast (APPLIED)
Comparison: None.

CLINICAL DATA: RIGHT lower quadrant pain with vomiting.

EXAM:
CT ABDOMEN AND PELVIS WITH CONTRAST
TECHNIQUE: Multidetector CT imaging of the abdomen and pelvis was performed
using the standard protocol following bolus administration of
intravenous contrast.
CONTRAST:  100mL H3ZTSH-J99 IOPAMIDOL (H3ZTSH-J99) INJECTION 61%

[Series 2: axial st · axial · 0.71mm/px · z∈[-488,-78]mm · 14 of 90 slices shown, 16 images]
[im 4/90  soft-tissue]
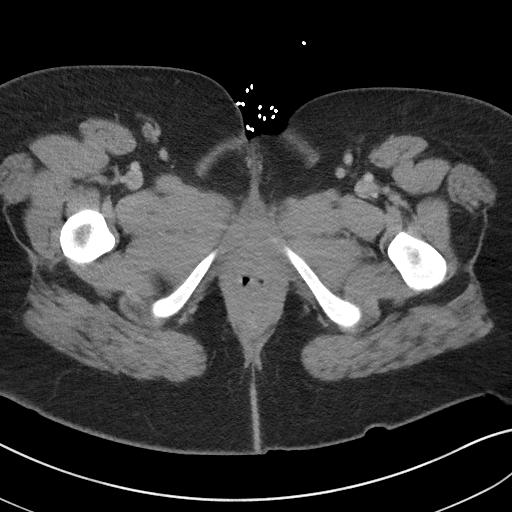
[im 4/90  bone]
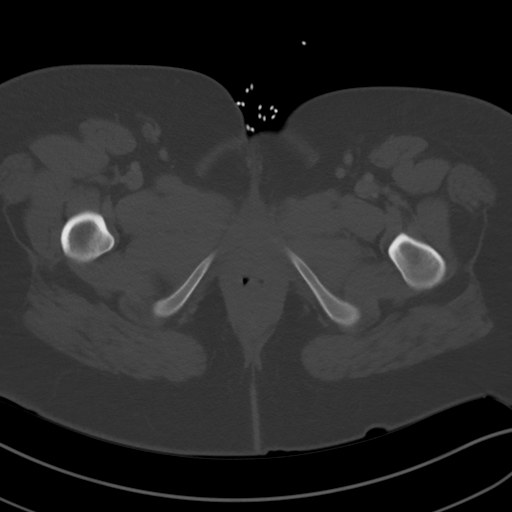
[im 12/90  soft-tissue]
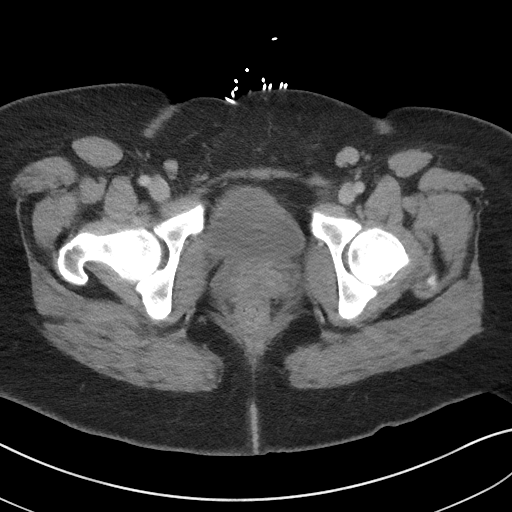
[im 19/90  soft-tissue]
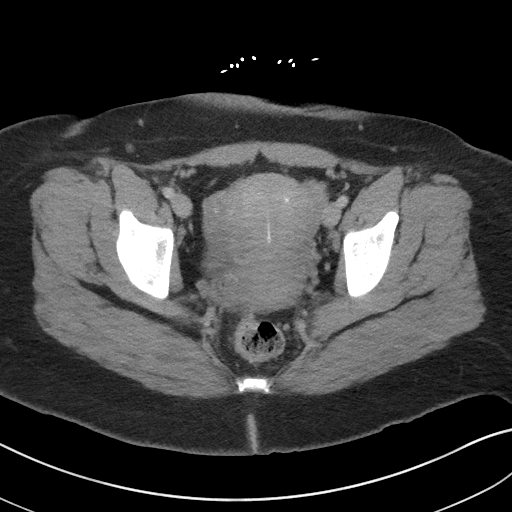
[im 23/90  soft-tissue]
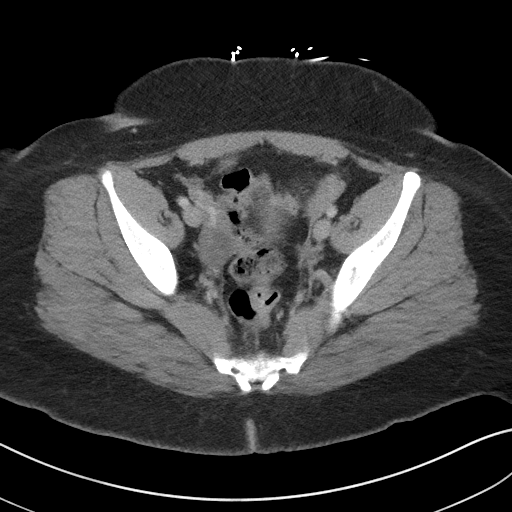
[im 30/90  soft-tissue]
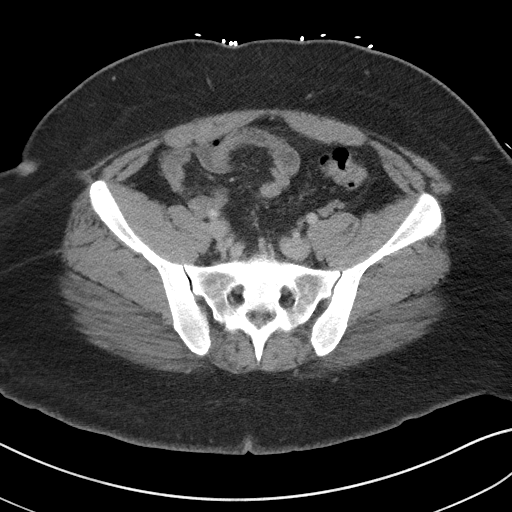
[im 38/90  soft-tissue]
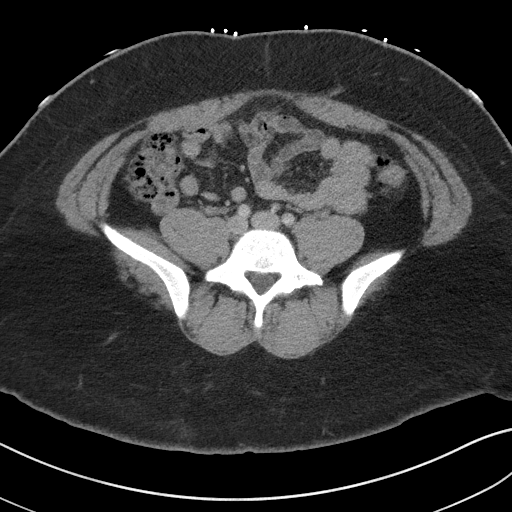
[im 41/90  soft-tissue]
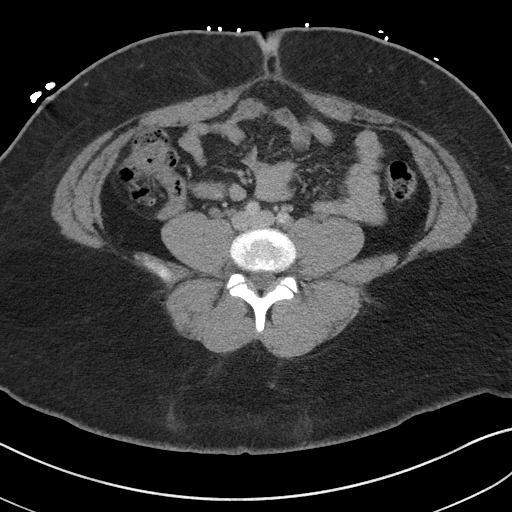
[im 49/90  soft-tissue]
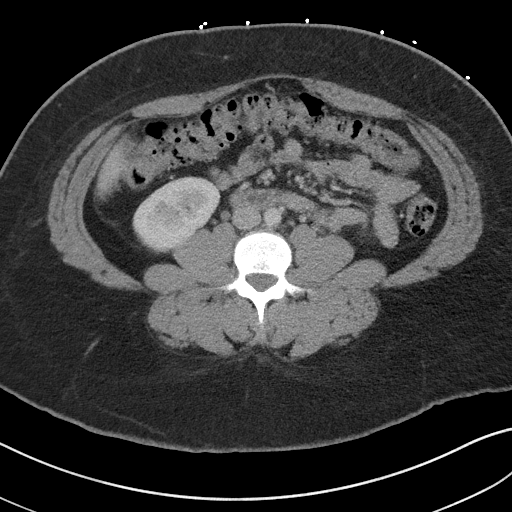
[im 52/90  soft-tissue]
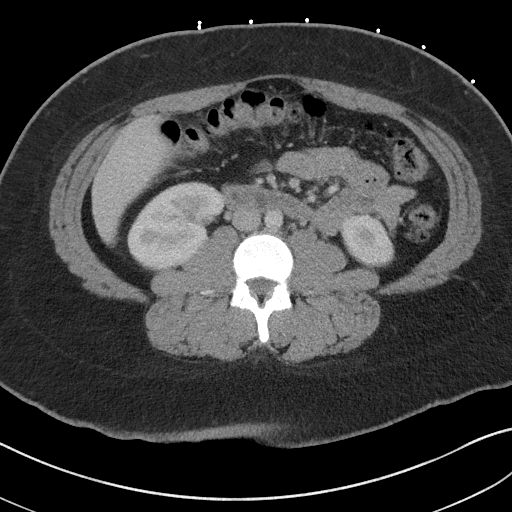
[im 52/90  bone]
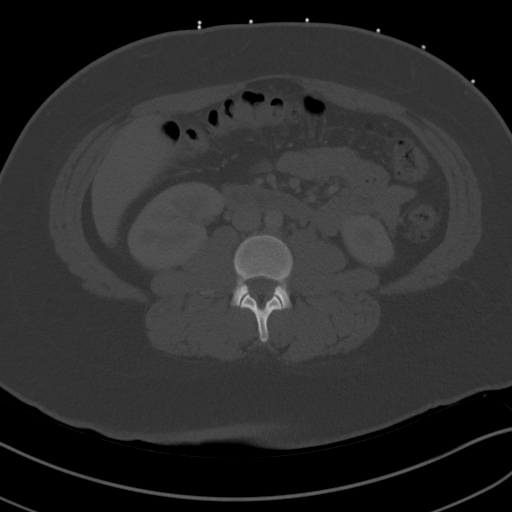
[im 60/90  soft-tissue]
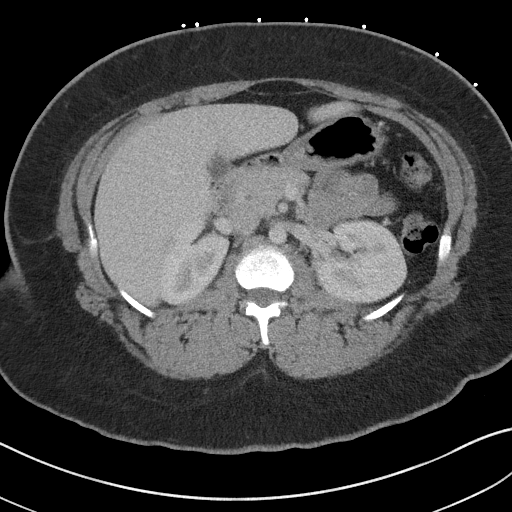
[im 67/90  soft-tissue]
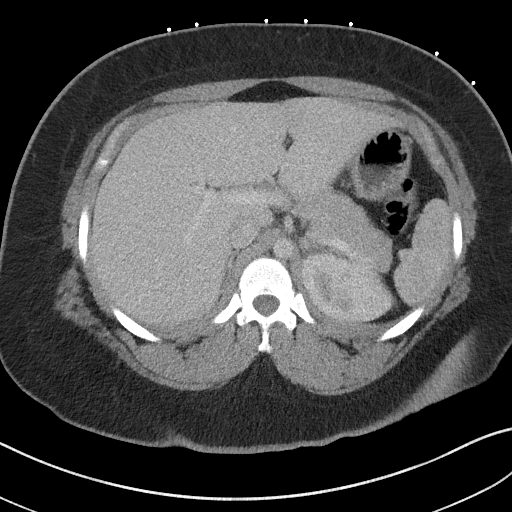
[im 71/90  soft-tissue]
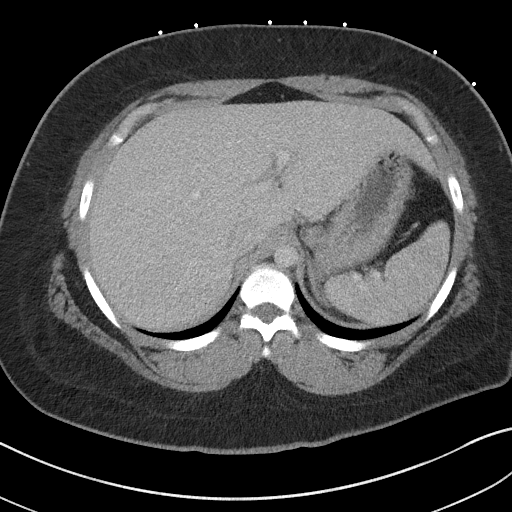
[im 78/90  soft-tissue]
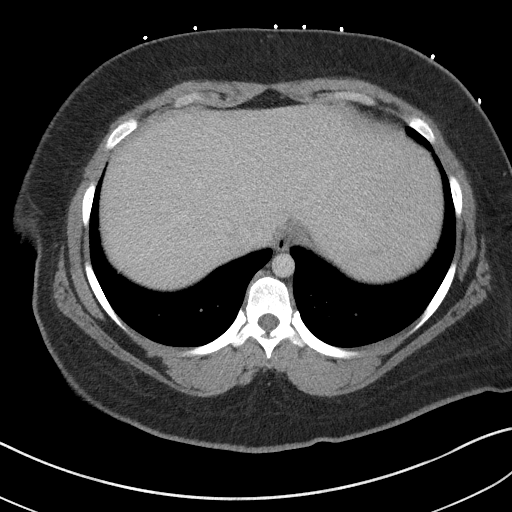
[im 86/90  soft-tissue]
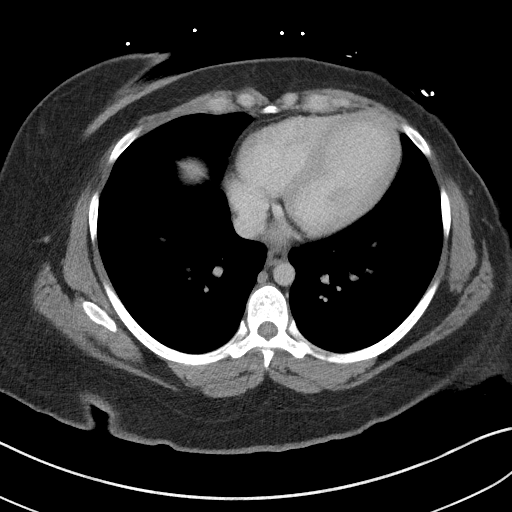

[Series 5: coronal st · coronal · 0.82mm/px · 3 of 106 slices shown]
[im 36/106  soft-tissue]
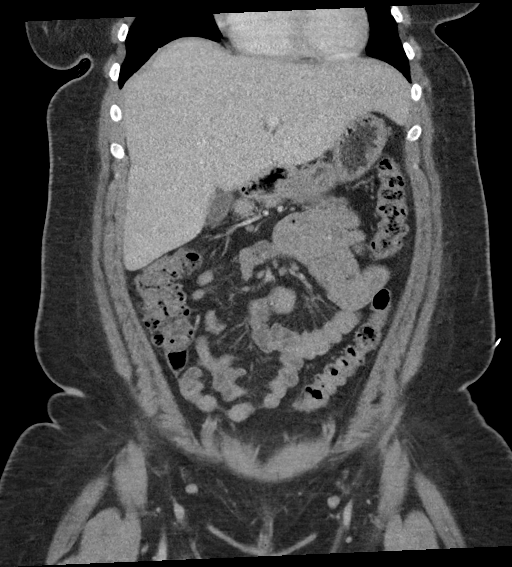
[im 47/106  soft-tissue]
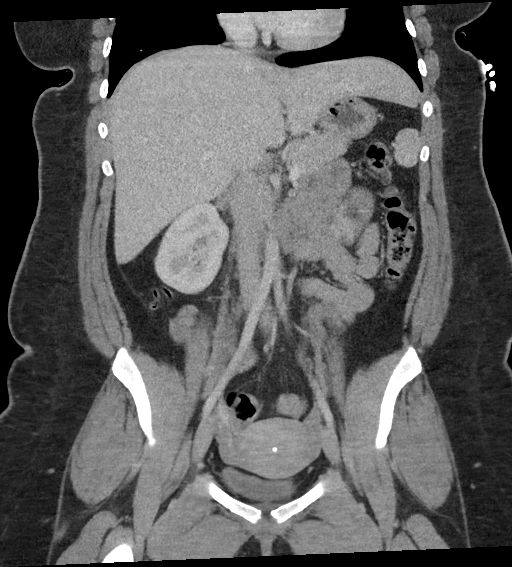
[im 59/106  soft-tissue]
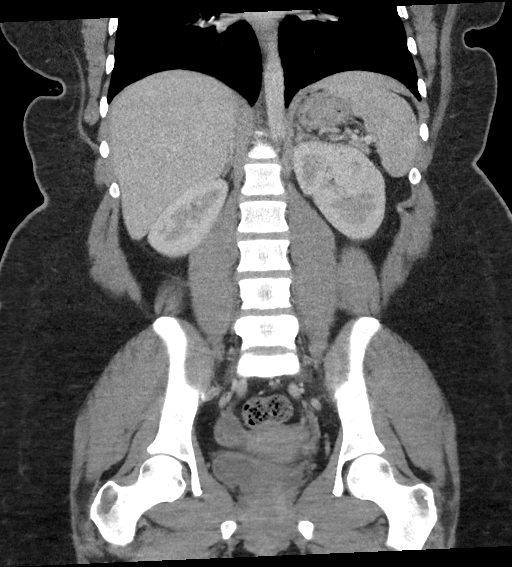

[17 of 46 positions shown; findings below may reference images not displayed]

FINDINGS: Lower chest: No acute abnormality.

Hepatobiliary: No focal liver abnormality is seen. No gallstones,
gallbladder wall thickening, or biliary dilatation.

Pancreas: Unremarkable. No pancreatic ductal dilatation or
surrounding inflammatory changes.

Spleen: Normal in size without focal abnormality.

Adrenals/Urinary Tract: Adrenal glands are unremarkable. Kidneys are
normal, without renal calculi, focal lesion, or hydronephrosis.
Bladder is unremarkable.

Stomach/Bowel: Stomach is within normal limits. Appendix is
retrocecal, and appears normal. No evidence of bowel wall
thickening, distention, or inflammatory changes.

Vascular/Lymphatic: No significant vascular findings are present. No
enlarged abdominal or pelvic lymph nodes.

Reproductive: Uterus and bilateral adnexa are unremarkable.

Other: No abdominal wall hernia or abnormality. No abdominopelvic
ascites.

Musculoskeletal: No acute or significant osseous findings.
IMPRESSION: Negative exam.  Specifically, no evidence for acute appendicitis.

## 2021-03-17 ENCOUNTER — Ambulatory Visit: Payer: Medicaid Other

## 2021-04-28 ENCOUNTER — Ambulatory Visit: Payer: Medicaid Other

## 2022-03-03 ENCOUNTER — Ambulatory Visit: Payer: Medicaid Other

## 2022-03-03 ENCOUNTER — Ambulatory Visit: Payer: Self-pay

## 2022-03-11 ENCOUNTER — Ambulatory Visit: Payer: Self-pay

## 2022-05-13 ENCOUNTER — Ambulatory Visit: Payer: Self-pay | Admitting: Advanced Practice Midwife

## 2022-05-13 ENCOUNTER — Encounter: Payer: Self-pay | Admitting: Advanced Practice Midwife

## 2022-05-13 DIAGNOSIS — Z113 Encounter for screening for infections with a predominantly sexual mode of transmission: Secondary | ICD-10-CM

## 2022-05-13 LAB — HM HIV SCREENING LAB: HM HIV Screening: NEGATIVE

## 2022-05-13 NOTE — Progress Notes (Signed)
Reviewed In House Lab at visit. No treatments needed. Declined Animator. BThiele RN

## 2022-05-13 NOTE — Progress Notes (Signed)
[facility] Department  STI clinic/screening visit [place] [id number] [phone]  Subjective:  Kerry Hester is a 26 y.o. SBF nonsmoker female being seen today for an STI screening visit. The patient reports they do not have symptoms.  Patient reports that they do not desire a pregnancy in the next year.   They reported they are not interested in discussing contraception today.    Patient's last menstrual period was 05/05/2022 (exact date).   Patient has the following medical conditions:   Patient Active Problem List   Diagnosis Date Noted   History of chlamydia infection 01/10/2018   Morbid obesity ([place]) 236 lbs 06/21/2015    Chief Complaint  Patient presents with   STI screening    HPI  Patient reports had lesion on left gluteal fold 05/05/22-05/07/22 which healed and is worried it was herpes. Has never had herpes before. Asymptomatic. Last sex 05/09/22  without condom; with current partner x 1 year; 1 partner in last 3 mo. Last MJ 07/2017..  Does the patient using douching products? No  Last HIV test per patient/review of record was No results found for: "HMHIVSCREEN"  Lab Results  Component Value Date   HIV Non Reactive 08/17/2017   Patient reports last pap was No results found for: "DIAGPAP"   Screening for MPX risk: Does the patient have an unexplained rash? No Is the patient MSM? No Does the patient endorse multiple sex partners or anonymous sex partners? No Did the patient have close or sexual contact with a person diagnosed with MPX? No Has the patient traveled outside the [place] where MPX is endemic? No Is there a high clinical suspicion for MPX-- evidenced by one of the following No  -Unlikely to be chickenpox  -Lymphadenopathy  -Rash that present in same phase of evolution on any given body part See flowsheet for further details and programmatic requirements.   Immunization history:  Immunization History   Administered Date(s) Administered   HPV Quadrivalent 03/01/2007, 02/25/2010, 08/04/2011   Hepatitis A, Ped/Adol-2 Dose 03/01/2007, 02/25/2010   Hepatitis B, PED/ADOLESCENT 01/07/1996, 07/04/1996   Hepatitis B, adult Jul [birth year], 1996   MMR 04/16/1997, 02/17/2001   Meningococcal Conjugate 02/25/2010   Tdap 11/23/2017   Varicella 04/16/1997, 03/01/2007     The following portions of the patient's history were reviewed and updated as appropriate: allergies, current medications, past medical history, past social history, past surgical history and problem list.  Objective:  There were no vitals filed for this visit.  Physical Exam Vitals and nursing note reviewed.  Constitutional:      Appearance: Normal appearance. She is obese.  HENT:     Head: Normocephalic and atraumatic.     Mouth/Throat:     Mouth: Mucous membranes are moist.     Pharynx: Oropharynx is clear. No oropharyngeal exudate or posterior oropharyngeal erythema.  Eyes:     Conjunctiva/sclera: Conjunctivae normal.  Pulmonary:     Effort: Pulmonary effort is normal.  Abdominal:     Palpations: Abdomen is soft. There is no mass.     Tenderness: There is no abdominal tenderness. There is no rebound.     Comments: Soft without masses or tenderness, increased adipose  Genitourinary:    General: Normal vulva.     Exam position: Lithotomy position.     Pubic Area: No rash or pubic lice.      Labia:        Right: No rash or lesion.  Left: No rash or lesion.      Vagina: Vaginal discharge (light red leukorrhea, ph>4.5) present. No erythema, bleeding or lesions.     Cervix: Normal.     Rectum: Normal.     Comments: pH = >4.5 Difficult to assess due to increased adipose In gluteal fold on left side is a healed hypopigmented area size of a dime flat, nontender Lymphadenopathy:     Head:     Right side of head: No preauricular or posterior auricular adenopathy.     Left side of head: No preauricular or posterior auricular  adenopathy.     Cervical: No cervical adenopathy.     Right cervical: No superficial, deep or posterior cervical adenopathy.    Left cervical: No superficial, deep or posterior cervical adenopathy.     Upper Body:     Right upper body: No supraclavicular, axillary or epitrochlear adenopathy.     Left upper body: No supraclavicular, axillary or epitrochlear adenopathy.     Lower Body: No right inguinal adenopathy. No left inguinal adenopathy.  Skin:    General: Skin is warm and dry.     Findings: No rash.  Neurological:     Mental Status: She is alert and oriented to person, place, and time.      Assessment and Plan:  Kerry Hester is a 26 y.o. female presenting to the [facility] Department for STI screening  1. Screening examination for venereal disease Treat wet mount per standing orders Immunization nurse consult  - HIV [place] [facility] - WET PREP FOR [place], YEAST, CLUE - Chlamydia/Gonorrhea [place] - Syphilis Serology, [place] [facility]     Return if symptoms worsen or fail to improve.  No future appointments.  Herbie Saxon, CNM

## 2022-05-29 LAB — WET PREP FOR TRICH, YEAST, CLUE
Trichomonas Exam: NEGATIVE
Yeast Exam: NEGATIVE

## 2022-08-22 ENCOUNTER — Ambulatory Visit
Admission: EM | Admit: 2022-08-22 | Discharge: 2022-08-22 | Discharge status: Absent without leave | Payer: Medicaid Other

## 2022-11-12 ENCOUNTER — Ambulatory Visit: Payer: Self-pay | Admitting: Advanced Practice Midwife

## 2022-11-12 ENCOUNTER — Encounter: Payer: Self-pay | Admitting: Advanced Practice Midwife

## 2022-11-12 DIAGNOSIS — Z113 Encounter for screening for infections with a predominantly sexual mode of transmission: Secondary | ICD-10-CM

## 2022-11-12 LAB — WET PREP FOR TRICH, YEAST, CLUE: Trichomonas Exam: NEGATIVE

## 2022-11-12 LAB — HM HIV SCREENING LAB: HM HIV Screening: NEGATIVE

## 2022-11-12 LAB — HM HEPATITIS C SCREENING LAB: HM Hepatitis Screen: NEGATIVE

## 2022-11-12 NOTE — Progress Notes (Signed)
[facility] Department  STI clinic/screening visit [address] [place] [address] [phone]  Subjective:  Kerry Hester is a 27 y.o. SBF exsmoker G2P2 female being seen today for an STI screening visit. The patient reports they do not have symptoms.  Patient reports that they  aren't sure if  desire a pregnancy in the next year.   They reported they are not interested in discussing contraception today.    Patient's last menstrual period was 10/15/2022 (approximate).  Patient has the following medical conditions:   Patient Active Problem List   Diagnosis Date Noted   History of chlamydia infection 01/10/2018   Morbid obesity (HCC) 236 lbs 06/21/2015    Chief Complaint  Patient presents with   SEXUALLY TRANSMITTED DISEASE    Screening    HPI  Patient reports asymptomatic. LMP 10/17/22. Last sex 11/08/22 without condom; with current partner x 3 mo; 2 partners in last 3 mo. Last cig 3 years ago. Last MJ 3 years ago. Last ETOH 11/08/22 (3+ shots Tequila). Last pap 08/17/17 neg. Mirena inserted 4 years ago.   Does the patient using douching products? No  Last HIV test per patient/review of record was  Lab Results  Component Value Date   HMHIVSCREEN Negative - Validated 05/13/2022    Lab Results  Component Value Date   HIV Non Reactive 08/17/2017   Patient reports last pap was No results found for: "DIAGPAP"  Lab Results  Component Value Date   SPECADGYN Comment 08/17/2017    Screening for MPX risk: Does the patient have an unexplained rash? No Is the patient MSM? No Does the patient endorse multiple sex partners or anonymous sex partners? Yes Did the patient have close or sexual contact with a person diagnosed with MPX? No Has the patient traveled outside the [place] where MPX is endemic? No Is there a high clinical suspicion for MPX-- evidenced by one of the following No  -Unlikely to be chickenpox  -Lymphadenopathy  -Rash that present in same  phase of evolution on any given body part See flowsheet for further details and programmatic requirements.   Immunization history:  Immunization History  Administered Date(s) Administered   HPV Quadrivalent 03/01/2007, 02/25/2010, 08/04/2011   Hepatitis A, Ped/Adol-2 Dose 03/01/2007, 02/25/2010   Hepatitis B, ADULT 03-04-1996   Hepatitis B, PED/ADOLESCENT 01/07/1996, 07/04/1996   MMR 04/16/1997, 02/17/2001   Meningococcal Conjugate 02/25/2010   Tdap 11/23/2017   Varicella 04/16/1997, 03/01/2007     The following portions of the patient's history were reviewed and updated as appropriate: allergies, current medications, past medical history, past social history, past surgical history and problem list.  Objective:  There were no vitals filed for this visit.  Physical Exam Vitals and nursing note reviewed.  Constitutional:      Appearance: Normal appearance. She is obese.  HENT:     Head: Normocephalic and atraumatic.     Mouth/Throat:     Mouth: Mucous membranes are moist.     Pharynx: Oropharynx is clear. No oropharyngeal exudate or posterior oropharyngeal erythema.  Eyes:     Conjunctiva/sclera: Conjunctivae normal.  Pulmonary:     Effort: Pulmonary effort is normal.  Abdominal:     Palpations: Abdomen is soft. There is no mass.     Tenderness: There is no abdominal tenderness. There is no rebound.     Comments: Soft without masses or tenderness, poor tone  Genitourinary:    General: Normal vulva.     Exam position: Lithotomy  position.     Pubic Area: No rash or pubic lice.      Labia:        Right: No rash or lesion.        Left: No rash or lesion.      Vagina: Vaginal discharge (grey malodorous leukorrhea, ph>4.5) present. No erythema, bleeding or lesions.     Cervix: Normal.     Uterus: Normal.      Adnexa: Right adnexa normal and left adnexa normal.     Rectum: Normal.     Comments: pH = >4.5 Lymphadenopathy:     Head:     Right side of head: No preauricular or  posterior auricular adenopathy.     Left side of head: No preauricular or posterior auricular adenopathy.     Cervical: No cervical adenopathy.     Right cervical: No superficial, deep or posterior cervical adenopathy.    Left cervical: No superficial, deep or posterior cervical adenopathy.     Upper Body:     Right upper body: No supraclavicular, axillary or epitrochlear adenopathy.     Left upper body: No supraclavicular, axillary or epitrochlear adenopathy.     Lower Body: No right inguinal adenopathy. No left inguinal adenopathy.  Skin:    General: Skin is warm and dry.     Findings: No rash.  Neurological:     Mental Status: She is alert and oriented to person, place, and time.      Assessment and Plan:  Melane SOLVEIG FANGMAN is a 27 y.o. female presenting to the [facility] Department for STI screening  1. Screening examination for venereal disease Treat wet mount per standing orders Immunization nurse consult  - WET PREP FOR TRICH, YEAST, CLUE - Chlamydia/Gonorrhea [facility] - HIV/HCV [place] [facility] - Syphilis Serology, [facility]   Patient accepted all screenings including  vaginal CT/GC and bloodwork for HIV/RPR, and wet prep. Patient meets criteria for HepB screening? No. Ordered? no Patient meets criteria for HepC screening? Yes. Ordered? yes  Treat wet prep per standing order Discussed time line for [facility] results and that patient will be called with positive results and encouraged patient to call if she had not heard in 2 weeks.  Counseled to return or seek care for continued or worsening symptoms Recommended repeat testing in 3 months with positive results. Recommended condom use with all sex  Patient is currently using *Mirena to prevent pregnancy.    No follow-ups on file.  Future Appointments  Date Time Provider Department Center  11/12/2022  9:35 AM Guenevere Roorda, Austin Miles, CNM [facility] None    Alberteen Spindle, CNM

## 2022-11-12 NOTE — Progress Notes (Signed)
Pt is here for STD screening.  Wet mount results reviewed, no treatment required per Provider.  Condoms declined.  Avaiyah Strubel M Larin Weissberg, RN  

## 2023-06-22 ENCOUNTER — Encounter: Payer: Self-pay | Admitting: Nurse Practitioner

## 2023-06-22 ENCOUNTER — Ambulatory Visit: Payer: Medicaid Other | Admitting: Nurse Practitioner

## 2023-06-22 VITALS — BP 125/74 | HR 74 | Ht 60.0 in | Wt 231.0 lb

## 2023-06-22 DIAGNOSIS — Z3009 Encounter for other general counseling and advice on contraception: Secondary | ICD-10-CM

## 2023-06-22 DIAGNOSIS — Z01419 Encounter for gynecological examination (general) (routine) without abnormal findings: Secondary | ICD-10-CM | POA: Diagnosis not present

## 2023-06-22 DIAGNOSIS — Z113 Encounter for screening for infections with a predominantly sexual mode of transmission: Secondary | ICD-10-CM

## 2023-06-22 DIAGNOSIS — Z309 Encounter for contraceptive management, unspecified: Secondary | ICD-10-CM

## 2023-06-22 LAB — WET PREP FOR TRICH, YEAST, CLUE
Trichomonas Exam: NEGATIVE
Yeast Exam: NEGATIVE

## 2023-06-22 LAB — HM HIV SCREENING LAB: HM HIV Screening: NEGATIVE

## 2023-06-22 LAB — HEPATITIS B SURFACE ANTIGEN: Hepatitis B Surface Ag: NONREACTIVE

## 2023-06-22 NOTE — Progress Notes (Signed)
[facility] [address] Main Number: [phone]  Family Planning Visit- Repeat Yearly Visit  Subjective:  Kerry Hester is a 27 y.o. G2P2002  being seen today for an annual wellness visit and to discuss contraception options.   The patient is currently using IUD or IUS for pregnancy prevention. Patient does not want a pregnancy in the next year.   Patient has the following medical problems: has Morbid obesity (HCC) 236 lbs and History of chlamydia infection on their problem list.  Chief Complaint  Patient presents with   Annual Exam    Patient reports no concerns today. She has 1 female partner and sometimes uses condoms. She practices vaginal sex. Reports a history of chlamydia in 2010-2012, unsure exactly. Has Mirena in place now since 2019 (5 years) with irregular periods since having this place. Last PAP 08/17/2017=NILM. Routine PAP today.   See flowsheet for other program required questions.   Body mass index is 45.11 kg/m. - Patient is eligible for diabetes screening based on BMI> 25 and age >35?  no HA1C ordered? not applicable  Patient reports 1 of partners in last year. Desires STI screening?  Yes   Has patient been screened once for HCV in the past?  No  No results found for: "HCVAB"  Does the patient have current of drug use, have a partner with drug use, and/or has been incarcerated since last result? Yes  If yes-- Screen for HCV through [facility]   Does the patient meet criteria for HBV testing? Yes  Criteria:  -Household, sexual or needle sharing contact with HBV -History of drug use -HIV positive -Those with known Hep C   Health Maintenance Due  Topic Date Due   Cervical Cancer Screening (Pap smear)  08/17/2020   INFLUENZA VACCINE  Never done   COVID-19 Vaccine (1 - 2023-24 season) Never done    Review of Systems  All other systems reviewed and are negative.   The following portions  of the patient's history were reviewed and updated as appropriate: allergies, current medications, past family history, past medical history, past social history, past surgical history and problem list. Problem list updated.  Objective:   Vitals:   06/22/23 0944  BP: 125/74  Pulse: 74  Weight: 231 lb (104.8 kg)  Height: 5' (1.524 m)    Physical Exam Vitals and nursing note reviewed. Exam conducted with a chaperone present Ike Bene, CNA present in room as chaperone.).  Constitutional:      Appearance: Normal appearance.  HENT:     Head: Normocephalic.     Salivary Glands: Right salivary gland is not diffusely enlarged or tender. Left salivary gland is not diffusely enlarged or tender.     Mouth/Throat:     Lips: Pink. No lesions.     Mouth: Mucous membranes are moist.     Tongue: No lesions. Tongue does not deviate from midline.     Pharynx: Oropharynx is clear. Uvula midline. No oropharyngeal exudate or posterior oropharyngeal erythema.     Tonsils: No tonsillar exudate.  Eyes:     General:        Right eye: No discharge.        Left eye: No discharge.  Cardiovascular:     Rate and Rhythm: Normal rate and regular rhythm.     Heart sounds: Normal heart sounds, S1 normal and S2 normal.  Pulmonary:     Effort: Pulmonary effort is normal.  Breath sounds: Normal breath sounds and air entry.  Chest:  Breasts:    Tanner Score is 5.     Breasts are symmetrical.     Right: Normal. No swelling, bleeding, inverted nipple, mass, nipple discharge, skin change or tenderness.     Left: Normal. No swelling, bleeding, inverted nipple, mass, nipple discharge, skin change or tenderness.  Abdominal:     General: Abdomen is flat. Bowel sounds are normal. There is no distension.     Palpations: Abdomen is soft.     Tenderness: There is no abdominal tenderness. There is no right CVA tenderness, left CVA tenderness, guarding or rebound.  Genitourinary:    Exam position: Lithotomy  position.     Pubic Area: No rash or pubic lice.      Tanner stage (genital): 5.     Labia:        Right: No rash, tenderness, lesion or injury.        Left: No rash, tenderness, lesion or injury.      Vagina: No signs of injury. Vaginal discharge present. No erythema, tenderness, bleeding or lesions.     Cervix: Discharge present. No cervical motion tenderness, friability, lesion, erythema, cervical bleeding or eversion.     Uterus: Normal.      Adnexa: Right adnexa normal and left adnexa normal.     Comments: pH<4.5 Amine odor present on PE.  Minimal amount of white non-adherent vaginal discharge present from cervix and within vaginal canal.  Lymphadenopathy:     Head:     Right side of head: No submental, submandibular, tonsillar, preauricular or posterior auricular adenopathy.     Left side of head: No submental, submandibular, tonsillar, preauricular or posterior auricular adenopathy.     Cervical: No cervical adenopathy.     Right cervical: No superficial or posterior cervical adenopathy.    Left cervical: No superficial or posterior cervical adenopathy.     Upper Body:     Right upper body: No supraclavicular or axillary adenopathy.     Left upper body: No supraclavicular or axillary adenopathy.     Lower Body: No right inguinal adenopathy. No left inguinal adenopathy.  Skin:    General: Skin is warm and dry.          Comments: Skin tone appropriate for ethnicity. Exposed areas only.  Patient reports eczema exacerbation with dry pruritic skin. Minimal dryness noted on physical exam between shoulder blades on back.     Neurological:     Mental Status: She is alert and oriented to person, place, and time.  Psychiatric:        Attention and Perception: Attention normal.        Mood and Affect: Mood and affect normal.        Speech: Speech normal.        Behavior: Behavior normal. Behavior is cooperative.        Thought Content: Thought content normal.    Assessment and  Plan:  Kerry Hester is a 27 y.o. female G2P2002 presenting to the [facility] Department for an yearly wellness and contraception visit  1. Screening for venereal diseas Per patient request, patient history of incarceration and drug use, and clinic policy STI testing performed today including HBV and HCV.  - Chlamydia/Gonorrhea [facility] - HIV/HCV [place] [facility] - HBV Antigen/Antibody [facility] - Syphilis Serology, [facility] - WET PREP FOR TRICH, YEAST, CLUE  2. Family planning Contraception counseling: Reviewed options based  on patient desire and reproductive life plan. Patient is interested in continuing with Mirena at this time (good for another 2.5 years).   Risks, benefits, and typical effectiveness rates were reviewed.  Questions were answered.  Written information was also given to the patient to review.    The patient will follow up in  1 years for surveillance.  The patient was told to call with any further questions, or with any concerns about this method of contraception.  Emphasized use of condoms 100% of the time for STI prevention.  Educated on ECP and assessed need for ECP. Patient was NOT offered ECP based on no unprotected sex.  Contraception counseling: Reviewed options based on patient desire and reproductive life plan. Patient is interested in continuing with Mirena at this time (good for another 2.5 years).   Risks, benefits, and typical effectiveness rates were reviewed.  Questions were answered.  Written information was also given to the patient to review.    The patient will follow up in  1 years for surveillance.  The patient was told to call with any further questions, or with any concerns about this method of contraception.  Emphasized use of condoms 100% of the time for STI prevention.  Educated on ECP and assessed need for ECP. Patient was NOT offered ECP based on no unprotected sex.   3. Well woman exam PAP performed in office today based  on ASCCP guidelines.  - IGP, rfx Aptima HPV ASCU CBE performed today. Next due 06/21/26.  Return in about 1 year (around 06/21/2024) for Annual wellness exam.  No future appointments.  Edmonia James, NP

## 2023-06-23 NOTE — Progress Notes (Signed)

## 2023-07-09 LAB — IGP, RFX APTIMA HPV ASCU: PAP Smear Comment: 0

## 2023-07-09 LAB — HPV APTIMA: HPV Aptima: NEGATIVE

## 2023-07-11 NOTE — Progress Notes (Signed)
ASCUS, HPV (-). Per ASCCP guidelines repeat PAP in 3 years. Please mail letter to client. Thank you, Marylu Lund L. Yareliz Thorstenson, FNP-C

## 2024-01-25 ENCOUNTER — Ambulatory Visit

## 2024-03-28 ENCOUNTER — Ambulatory Visit

## 2024-05-10 ENCOUNTER — Ambulatory Visit

## 2024-05-10 DIAGNOSIS — Z113 Encounter for screening for infections with a predominantly sexual mode of transmission: Secondary | ICD-10-CM

## 2024-05-10 LAB — HM HIV SCREENING LAB: HM HIV Screening: NEGATIVE

## 2024-05-10 LAB — WET PREP FOR TRICH, YEAST, CLUE
Clue Cell Exam: NEGATIVE
Trichomonas Exam: NEGATIVE
Yeast Exam: NEGATIVE

## 2024-05-10 NOTE — Progress Notes (Signed)
 [facility] Department STI clinic [address] [place] [address] Main phone: [phone]  STI screening visit  Subjective:  Kerry Hester is a 28 y.o. female being seen today for an STI screening visit. The patient reports they do not have symptoms.  Patient reports that they may desire a pregnancy in the next year. Patient is currently using IUD for contraception.   No LMP recorded. (Menstrual status: IUD).  Patient has the following medical conditions:  Patient Active Problem List   Diagnosis Date Noted   History of chlamydia infection 01/10/2018   Morbid obesity (HCC) 236 lbs 06/21/2015   Chief Complaint  Patient presents with   SEXUALLY TRANSMITTED DISEASE   HPI Patient reports desire for STI testing. No symptoms or contacts.  Does the patient using douching products? No  See flowsheet for further details and programmatic requirements Hyperlink available at the top of the signed note in blue.  Flow sheet content below:  Pregnancy Intention Screening Does the patient want to become pregnant in the next year?: Yes Does the patient's partner want to become pregnant in the next year?: Yes Would the patient like to discuss contraceptive options today?: No Reason For STD Screen STD Screening: Is asymptomatic Have you ever had an STD?: Yes History of Antibiotic use in the past 2 weeks?: No STD Symptoms Denies all: Yes Risk Factors for Hep B Household, sexual, or needle sharing contact of a person infected with Hep B: No Sexual contact with a person who uses drugs not as prescribed?: No Currently or Ever used drugs not as prescribed: No HIV Positive: No PRep Patient: No Men who have sex with men: No Have Hepatitis C: No History of Incarceration: No History of Homeslessness?: No Anal sex following anal drug use?: No Risk Factors for Hep C Currently using drugs not as prescribed: No Sexual partner(s) currently using drugs as  not prescribed: No History of drug use: No HIV Positive: No People with a history of incarceration: No People born between the years of 54 and 65: No Abuse History Has patient ever been abused physically?: No Has patient ever been abused sexually?: No Does patient feel they have a problem with Anxiety?: Yes Does patient feel they have a problem with Depression?: Yes Counseling Patient counseled to use condoms with all sex: Condoms given RTC in 2-3 weeks for test results: Yes Clinic will call if test results abnormal before test result appt.: Yes Test results given to patient Patient counseled to use condoms with all sex: Condoms given   Screening for MPX risk:  Unexplained rash?  No   MSM?  No   Multiple or anonymous sex partners?  No   Any close or sexual contact with a person  diagnosed with MPX?  No   Any outside the [place]  where MPX is endemic?  No   High clinical suspicion for MPX?    -Unlikely to be chickenpox    -Lymphadenopathy    -Rash that presents in same phase of       evolution on any given body part  No   Screenings: Last HIV test per patient/review of record was  Lab Results  Component Value Date   HMHIVSCREEN Negative - Validated 06/22/2023    Lab Results  Component Value Date   HIV Non Reactive 08/17/2017     Last HEPC test per patient/review of record was  Lab Results  Component Value Date   HMHEPCSCREEN Negative-Validated 11/12/2022   No  components found for: HEPC   Last HEPB test per patient/review of record was No components found for: HMHEPBSCREEN   Patient reports last pap was:   Lab Results  Component Value Date   SPECADGYN Comment 06/22/2023   Result Date Procedure Results Follow-ups  06/22/2023 IGP, rfx Aptima HPV ASCU DIAGNOSIS:: Comment (A) Specimen adequacy:: Comment Clinician Provided ICD10: Comment Performed by:: Comment Electronically signed by:: Comment PAP Smear Comment: . PATHOLOGIST PROVIDED ICD10:: Comment Note::  Comment Test Methodology: Comment PAP Reflex: Comment   08/17/2017 Pap IG w/ reflex to HPV when ASC-U DIAGNOSIS:: Comment Specimen adequacy:: Comment Clinician Provided ICD10: Comment Performed by:: Comment PAP Smear Comment: . Note:: Comment Test Methodology: Comment PAP Reflex: Comment     Immunization history:  Immunization History  Administered Date(s) Administered   HPV Quadrivalent 03/01/2007, 02/25/2010, 08/04/2011   Hepatitis A, Ped/Adol-2 Dose 03/01/2007, 02/25/2010   Hepatitis B, ADULT 12/03/95   Hepatitis B, PED/ADOLESCENT 01/07/1996, 07/04/1996   MMR 04/16/1997, 02/17/2001   Meningococcal Conjugate 02/25/2010   Tdap 11/23/2017   Varicella 04/16/1997, 03/01/2007    The following portions of the patient's history were reviewed and updated as appropriate: allergies, current medications, past medical history, past social history, past surgical history and problem list.  Objective:  There were no vitals filed for this visit.  Physical Exam Vitals and nursing note reviewed.  Constitutional:      Appearance: Normal appearance.  HENT:     Head: Normocephalic.     Mouth/Throat:     Mouth: Mucous membranes are moist.  Cardiovascular:     Rate and Rhythm: Normal rate.  Pulmonary:     Effort: Pulmonary effort is normal.  Abdominal:     Palpations: Abdomen is soft.  Genitourinary:    Comments: Declined genital exam- no symptoms, self swabbed Musculoskeletal:        General: Normal range of motion.  Lymphadenopathy:     Head:     Right side of head: No submandibular, preauricular or posterior auricular adenopathy.     Left side of head: No submandibular, preauricular or posterior auricular adenopathy.     Cervical: No cervical adenopathy.     Upper Body:     Right upper body: No supraclavicular or axillary adenopathy.     Left upper body: No supraclavicular or axillary adenopathy.  Skin:    General: Skin is warm and dry.  Neurological:     Mental Status: She  is alert and oriented to person, place, and time.  Psychiatric:        Mood and Affect: Mood normal.    Assessment and Plan:  [name] is a 28 y.o. female presenting to the [facility] Department for STI screening  1. Screening for venereal disease (Primary)  - WET PREP FOR TRICH, YEAST, CLUE - [facility] - Syphilis Serology, [facility] - Chlamydia/Gonorrhea  Lab   Patient accepted the following screenings: vaginal CT/GC swab, vaginal wet prep, HIV, and RPR Patient meets criteria for HepB screening? No. Ordered? no Patient meets criteria for HepC screening? No. Ordered? no  Treat wet prep per standing order Discussed time line for [facility] results and that patient will be called with positive results and encouraged patient to call if she had not heard in 2 weeks.  Counseled to return or seek care for continued or worsening symptoms Recommended repeat testing in 3 months with positive results. Recommended condom use with all sex for STI prevention.   Return  for STI testing as needed.  No future appointments.  Damien FORBES Satchel, NP

## 2024-05-10 NOTE — Progress Notes (Signed)
 Pt is here for STD screening. Wet prep results reviewed with patient and required no treatment per SO. Opportunity given to pt to ask questions for any clarifications. Questions answered. Condoms given. Kwadwo Jodean Valade,RN.
